# Patient Record
Sex: Female | Born: 1955
Health system: Southern US, Community
[De-identification: ages and names within clinical notes are randomized; demographics above are authoritative.]

## PROBLEM LIST (undated history)

## (undated) DIAGNOSIS — N809 Endometriosis, unspecified: Secondary | ICD-10-CM

## (undated) HISTORY — PX: ABDOMINAL HYSTERECTOMY: SHX81

---

## 2017-04-28 DIAGNOSIS — E119 Type 2 diabetes mellitus without complications: Secondary | ICD-10-CM | POA: Diagnosis present

## 2018-04-13 DIAGNOSIS — E0590 Thyrotoxicosis, unspecified without thyrotoxic crisis or storm: Secondary | ICD-10-CM | POA: Insufficient documentation

## 2019-03-08 DIAGNOSIS — B351 Tinea unguium: Secondary | ICD-10-CM | POA: Insufficient documentation

## 2019-12-14 ENCOUNTER — Encounter (HOSPITAL_COMMUNITY): Payer: Self-pay | Admitting: Internal Medicine

## 2019-12-14 ENCOUNTER — Ambulatory Visit (HOSPITAL_COMMUNITY)
Admission: EM | Admit: 2019-12-14 | Discharge: 2019-12-14 | Disposition: A | Discharge status: Nursing Home (Includes ICF) | Payer: Self-pay

## 2019-12-14 ENCOUNTER — Emergency Department (HOSPITAL_COMMUNITY): Payer: HRSA Program

## 2019-12-14 ENCOUNTER — Other Ambulatory Visit: Payer: Self-pay

## 2019-12-14 ENCOUNTER — Inpatient Hospital Stay (HOSPITAL_COMMUNITY)
Admission: EM | Admit: 2019-12-14 | Discharge: 2019-12-26 | DRG: 177 | Disposition: A | Discharge status: Home / Self Care | Payer: HRSA Program | Attending: Internal Medicine | Admitting: Internal Medicine

## 2019-12-14 ENCOUNTER — Encounter (HOSPITAL_COMMUNITY): Payer: Self-pay | Admitting: *Deleted

## 2019-12-14 DIAGNOSIS — T502X5A Adverse effect of carbonic-anhydrase inhibitors, benzothiadiazides and other diuretics, initial encounter: Secondary | ICD-10-CM | POA: Diagnosis present

## 2019-12-14 DIAGNOSIS — U071 COVID-19: Secondary | ICD-10-CM | POA: Diagnosis present

## 2019-12-14 DIAGNOSIS — E876 Hypokalemia: Secondary | ICD-10-CM | POA: Diagnosis present

## 2019-12-14 DIAGNOSIS — R0602 Shortness of breath: Secondary | ICD-10-CM

## 2019-12-14 DIAGNOSIS — E1165 Type 2 diabetes mellitus with hyperglycemia: Secondary | ICD-10-CM | POA: Diagnosis present

## 2019-12-14 DIAGNOSIS — R0902 Hypoxemia: Secondary | ICD-10-CM

## 2019-12-14 DIAGNOSIS — J069 Acute upper respiratory infection, unspecified: Secondary | ICD-10-CM | POA: Diagnosis present

## 2019-12-14 DIAGNOSIS — J9601 Acute respiratory failure with hypoxia: Secondary | ICD-10-CM | POA: Diagnosis present

## 2019-12-14 DIAGNOSIS — IMO0002 Reserved for concepts with insufficient information to code with codable children: Secondary | ICD-10-CM

## 2019-12-14 DIAGNOSIS — E119 Type 2 diabetes mellitus without complications: Secondary | ICD-10-CM | POA: Diagnosis present

## 2019-12-14 DIAGNOSIS — E871 Hypo-osmolality and hyponatremia: Secondary | ICD-10-CM | POA: Diagnosis present

## 2019-12-14 DIAGNOSIS — J1282 Pneumonia due to coronavirus disease 2019: Secondary | ICD-10-CM | POA: Diagnosis present

## 2019-12-14 DIAGNOSIS — Z90710 Acquired absence of both cervix and uterus: Secondary | ICD-10-CM

## 2019-12-14 DIAGNOSIS — R0603 Acute respiratory distress: Secondary | ICD-10-CM

## 2019-12-14 HISTORY — DX: Endometriosis, unspecified: N80.9

## 2019-12-14 LAB — FERRITIN: Ferritin: 508 ng/mL — ABNORMAL HIGH (ref 11–307)

## 2019-12-14 LAB — CBC WITH DIFFERENTIAL/PLATELET
Abs Immature Granulocytes: 0.02 10*3/uL (ref 0.00–0.07)
Basophils Absolute: 0 10*3/uL (ref 0.0–0.1)
Basophils Relative: 0 %
Eosinophils Absolute: 0 10*3/uL (ref 0.0–0.5)
Eosinophils Relative: 0 %
HCT: 37.3 % (ref 36.0–46.0)
Hemoglobin: 12.3 g/dL (ref 12.0–15.0)
Immature Granulocytes: 0 %
Lymphocytes Relative: 11 %
Lymphs Abs: 1 10*3/uL (ref 0.7–4.0)
MCH: 27.3 pg (ref 26.0–34.0)
MCHC: 33 g/dL (ref 30.0–36.0)
MCV: 82.7 fL (ref 80.0–100.0)
Monocytes Absolute: 0.4 10*3/uL (ref 0.1–1.0)
Monocytes Relative: 4 %
Neutro Abs: 7.1 10*3/uL (ref 1.7–7.7)
Neutrophils Relative %: 85 %
Platelets: 258 10*3/uL (ref 150–400)
RBC: 4.51 MIL/uL (ref 3.87–5.11)
RDW: 13.1 % (ref 11.5–15.5)
WBC: 8.4 10*3/uL (ref 4.0–10.5)
nRBC: 0 % (ref 0.0–0.2)

## 2019-12-14 LAB — LACTATE DEHYDROGENASE: LDH: 264 U/L — ABNORMAL HIGH (ref 98–192)

## 2019-12-14 LAB — URINALYSIS, ROUTINE W REFLEX MICROSCOPIC
Bilirubin Urine: NEGATIVE
Glucose, UA: NEGATIVE mg/dL
Hgb urine dipstick: NEGATIVE
Ketones, ur: NEGATIVE mg/dL
Leukocytes,Ua: NEGATIVE
Nitrite: NEGATIVE
Protein, ur: 100 mg/dL — AB
Specific Gravity, Urine: 1.014 (ref 1.005–1.030)
pH: 5 (ref 5.0–8.0)

## 2019-12-14 LAB — TRIGLYCERIDES: Triglycerides: 227 mg/dL — ABNORMAL HIGH (ref <150)

## 2019-12-14 LAB — COMPREHENSIVE METABOLIC PANEL
ALT: 25 U/L (ref 0–44)
AST: 35 U/L (ref 15–41)
Albumin: 3.2 g/dL — ABNORMAL LOW (ref 3.5–5.0)
Alkaline Phosphatase: 79 U/L (ref 38–126)
Anion gap: 13 (ref 5–15)
BUN: 9 mg/dL (ref 8–23)
CO2: 23 mmol/L (ref 22–32)
Calcium: 8.8 mg/dL — ABNORMAL LOW (ref 8.9–10.3)
Chloride: 101 mmol/L (ref 98–111)
Creatinine, Ser: 0.68 mg/dL (ref 0.44–1.00)
GFR calc Af Amer: >60 mL/min (ref >60)
GFR calc non Af Amer: >60 mL/min (ref >60)
Glucose, Bld: 169 mg/dL — ABNORMAL HIGH (ref 70–99)
Potassium: 3.2 mmol/L — ABNORMAL LOW (ref 3.5–5.1)
Sodium: 137 mmol/L (ref 135–145)
Total Bilirubin: 0.8 mg/dL (ref 0.3–1.2)
Total Protein: 7.1 g/dL (ref 6.5–8.1)

## 2019-12-14 LAB — FIBRINOGEN: Fibrinogen: >800 mg/dL — ABNORMAL HIGH (ref 210–475)

## 2019-12-14 LAB — D-DIMER, QUANTITATIVE: D-Dimer, Quant: 1.08 ug/mL-FEU — ABNORMAL HIGH (ref 0.00–0.50)

## 2019-12-14 LAB — MAGNESIUM: Magnesium: 1.9 mg/dL (ref 1.7–2.4)

## 2019-12-14 LAB — TROPONIN I (HIGH SENSITIVITY): Troponin I (High Sensitivity): 3 ng/L (ref <18)

## 2019-12-14 LAB — POC SARS CORONAVIRUS 2 AG -  ED: SARS Coronavirus 2 Ag: POSITIVE — AB

## 2019-12-14 LAB — PROCALCITONIN: Procalcitonin: <0.1 ng/mL

## 2019-12-14 LAB — C-REACTIVE PROTEIN: CRP: 19.9 mg/dL — ABNORMAL HIGH (ref <1.0)

## 2019-12-14 LAB — BRAIN NATRIURETIC PEPTIDE: B Natriuretic Peptide: 18.3 pg/mL (ref 0.0–100.0)

## 2019-12-14 MED ORDER — SODIUM CHLORIDE 0.9% FLUSH
3.0000 mL | Freq: Two times a day (BID) | INTRAVENOUS | Status: DC
  Administered 2019-12-14 – 2019-12-26 (×21): 3 mL via INTRAVENOUS

## 2019-12-14 MED ORDER — SODIUM CHLORIDE 0.9 % IV SOLN
100.0000 mg | Freq: Every day | INTRAVENOUS | Status: AC
  Administered 2019-12-15 – 2019-12-18 (×4): 100 mg via INTRAVENOUS
  Filled 2019-12-14 (×4): qty 20

## 2019-12-14 MED ORDER — ENOXAPARIN SODIUM 40 MG/0.4ML ~~LOC~~ SOLN
40.0000 mg | SUBCUTANEOUS | Status: DC
  Administered 2019-12-14 – 2019-12-25 (×12): 40 mg via SUBCUTANEOUS
  Filled 2019-12-14 (×12): qty 0.4

## 2019-12-14 MED ORDER — SODIUM CHLORIDE 0.9 % IV SOLN
200.0000 mg | Freq: Once | INTRAVENOUS | Status: AC
  Administered 2019-12-14: 200 mg via INTRAVENOUS
  Filled 2019-12-14: qty 40

## 2019-12-14 MED ORDER — TOCILIZUMAB 400 MG/20ML IV SOLN
8.0000 mg/kg | Freq: Once | INTRAVENOUS | Status: AC
  Administered 2019-12-15: 508 mg via INTRAVENOUS
  Filled 2019-12-14 (×3): qty 25.4

## 2019-12-14 MED ORDER — SODIUM CHLORIDE 0.9% FLUSH
3.0000 mL | Freq: Two times a day (BID) | INTRAVENOUS | Status: DC
  Administered 2019-12-14 – 2019-12-18 (×5): 3 mL via INTRAVENOUS

## 2019-12-14 MED ORDER — DEXAMETHASONE SODIUM PHOSPHATE 10 MG/ML IJ SOLN
6.0000 mg | Freq: Once | INTRAMUSCULAR | Status: AC
  Administered 2019-12-14: 6 mg via INTRAVENOUS
  Filled 2019-12-14: qty 1

## 2019-12-14 MED ORDER — DEXAMETHASONE SODIUM PHOSPHATE 10 MG/ML IJ SOLN
6.0000 mg | INTRAMUSCULAR | Status: AC
  Administered 2019-12-15 – 2019-12-23 (×9): 6 mg via INTRAVENOUS
  Filled 2019-12-14 (×10): qty 1

## 2019-12-14 MED ORDER — SODIUM CHLORIDE 0.9 % IV SOLN: 250.0000 mL | INTRAVENOUS | Status: DC | PRN

## 2019-12-14 MED ORDER — ONDANSETRON HCL 4 MG/2ML IJ SOLN: 4.0000 mg | Freq: Four times a day (QID) | INTRAMUSCULAR | Status: DC | PRN

## 2019-12-14 MED ORDER — ONDANSETRON HCL 4 MG PO TABS
4.0000 mg | ORAL_TABLET | Freq: Four times a day (QID) | ORAL | Status: DC | PRN
  Administered 2019-12-20: 4 mg via ORAL
  Filled 2019-12-14: qty 1

## 2019-12-14 MED ORDER — SODIUM CHLORIDE 0.9% FLUSH: 3.0000 mL | INTRAVENOUS | Status: DC | PRN

## 2019-12-14 MED ORDER — POTASSIUM CHLORIDE 10 MEQ/100ML IV SOLN
10.0000 meq | INTRAVENOUS | Status: AC
  Administered 2019-12-14 (×4): 10 meq via INTRAVENOUS
  Filled 2019-12-14 (×4): qty 100

## 2019-12-14 MED ORDER — GUAIFENESIN-DM 100-10 MG/5ML PO SYRP
10.0000 mL | ORAL_SOLUTION | ORAL | Status: DC | PRN
  Administered 2019-12-16 – 2019-12-25 (×5): 10 mL via ORAL
  Filled 2019-12-14 (×6): qty 10

## 2019-12-14 MED ORDER — ACETAMINOPHEN 325 MG PO TABS
650.0000 mg | ORAL_TABLET | Freq: Four times a day (QID) | ORAL | Status: DC | PRN
  Administered 2019-12-14 – 2019-12-25 (×7): 650 mg via ORAL
  Filled 2019-12-14 (×7): qty 2

## 2019-12-14 NOTE — ED Triage Notes (Signed)
Pt to ED via EMS from urgent care c/o shortness of breath. Pt known COVID positive- started experiencing increase in shortness of breath- so went to urgent care, o2 sats 75% on room air- 95% o2 sats on non rebreather. Per EMS pt took 1000mg  of Ibprofin earlier.

## 2019-12-14 NOTE — H&P (Signed)
Carrie Black VWA:[id number] DOB: May 06, 1956 DOA: 12/14/2019     PCP: System, Pcp Not In   Outpatient Specialists:  NONE    Patient arrived to ER on 12/14/19 at 1332  Patient coming from: home Lives  With family    Chief Complaint:   Chief Complaint  Patient presents with  . Shortness of Breath    COVID POSITIVE    HPI: Carrie Black is a 64 y.o. female with medical history significant of Covid test + December 13, 2019, endometriosis    Presented with  Cough, fever up to 100.4 have been reporting CP and SOB since yesterday. to care and was noted to be satting 76% on RA was placed on 5 L if her oxygenation improved to 90%.  She reports fevers at home up to 101.5 she has tried to take ibuprofen to control her fever.  She has been overall sick for the past 2 weeks but tested only positive yesterday. She has been treated by provider with prednisone  And azithromycin for the past few days but without improvement The been seen in urgent care she was transported by EMS to [facility], ER  Infectious risk factors:  Reports  fever, shortness of breath, dry cough, chest pain,     KNOWN COVID POSITIVE   In  ER RAPID COVID TEST    POSITIVE,    No results found for: SARSCOV2NAA    While in ER: Initially started nonrebreather 15 L with oxygen saturation 94% Started on remdesivir and dexamethasone     The following Work up has been ordered so far:  Orders Placed This Encounter  Procedures  . Critical Care  . Blood Culture (routine x 2)  . DG Chest Port 1 View  . Comprehensive metabolic panel  . CBC WITH DIFFERENTIAL  . Brain natriuretic peptide  . Urinalysis, Routine w reflex microscopic  . D-dimer, quantitative  . Procalcitonin  . Lactate dehydrogenase  . Ferritin  . Fibrinogen  . C-reactive protein  . Triglycerides  . Diet NPO time specified  . Cardiac monitoring  . Initiate Carrier Fluid Protocol  . Cardiac monitoring  . Initiate Carrier Fluid Protocol  . Place  surgical mask on patient  . Patient to wear surgical mask during transportation  . Assess patient for ability to self-prone. If able (can move self in bed, ambulate) and stable (SpO2 and oxygen requirement):  . RN/NT - Document specific oxygen requirements in CHL  . Notify EDP if new oxygen requirements escalates > 4L per minute [place]  . RN to draw the following extra tubes:  . Consult to hospitalist  ALL PATIENTS BEING ADMITTED/HAVING PROCEDURES NEED COVID-19 SCREENING  . Consult to hospitalist  ALL PATIENTS BEING ADMITTED/HAVING PROCEDURES NEED COVID-19 SCREENING  . remdesivir per pharmacy consult  . Airborne and Contact precautions  . Oxygen therapy  . Pulse oximetry, continuous  . Pulse oximetry, continuous  . POC SARS Coronavirus 2 Ag-ED - Nasal Swab (BD Veritor Kit)  . ED EKG  . EKG 12-Lead     Following Medications were ordered in ER: Medications  remdesivir 200 mg in sodium chloride 0.9% 250 mL IVPB (has no administration in time range)    Followed by  remdesivir 100 mg in sodium chloride 0.9 % 100 mL IVPB (has no administration in time range)  dexamethasone (DECADRON) injection 6 mg (6 mg Intravenous Given 12/14/19 1754)        Consult Orders  (From admission, onward)  Start     Ordered   12/14/19 1726  Consult to hospitalist  ALL PATIENTS BEING ADMITTED/HAVING PROCEDURES NEED COVID-19 SCREENING Pg sent by dee  Once    Comments: ALL PATIENTS BEING ADMITTED/HAVING PROCEDURES NEED COVID-19 SCREENING  Provider:  (Not yet assigned)  Question Answer Comment  Place call to: [facility]   Reason for Consult Admit      12/14/19 1725   12/14/19 1639  Consult to hospitalist  ALL PATIENTS BEING ADMITTED/HAVING PROCEDURES NEED COVID-19 SCREENING Pg sent by dee  Once    Comments: ALL PATIENTS BEING ADMITTED/HAVING PROCEDURES NEED COVID-19 SCREENING  Provider:  (Not yet assigned)  Question Answer Comment  Place call to: [facility]   Reason for Consult Admit        12/14/19 1638          Significant initial  Findings: Abnormal Labs Reviewed  COMPREHENSIVE METABOLIC PANEL - Abnormal; Notable for the following components:      Result Value   Potassium 3.2 (*)    Glucose, Bld 169 (*)    Calcium 8.8 (*)    Albumin 3.2 (*)    All other components within normal limits  URINALYSIS, ROUTINE W REFLEX MICROSCOPIC - Abnormal; Notable for the following components:   Protein, ur 100 (*)    Bacteria, UA RARE (*)    All other components within normal limits  D-DIMER, QUANTITATIVE (NOT AT [facility]) - Abnormal; Notable for the following components:   D-Dimer, Quant 1.08 (*)    All other components within normal limits  LACTATE DEHYDROGENASE - Abnormal; Notable for the following components:   LDH 264 (*)    All other components within normal limits  FERRITIN - Abnormal; Notable for the following components:   Ferritin 508 (*)    All other components within normal limits  FIBRINOGEN - Abnormal; Notable for the following components:   Fibrinogen >800 (*)    All other components within normal limits  C-REACTIVE PROTEIN - Abnormal; Notable for the following components:   CRP 19.9 (*)    All other components within normal limits  TRIGLYCERIDES - Abnormal; Notable for the following components:   Triglycerides 227 (*)    All other components within normal limits  POC SARS CORONAVIRUS 2 AG -  ED - Abnormal; Notable for the following components:   SARS Coronavirus 2 Ag POSITIVE (*)    All other components within normal limits    Otherwise labs showing:    Recent Labs  Lab 12/14/19 1407  NA 137  K 3.2*  CO2 23  GLUCOSE 169*  BUN 9  CREATININE 0.68  CALCIUM 8.8*    Cr    Stable  Lab Results  Component Value Date   CREATININE 0.68 12/14/2019    Recent Labs  Lab 12/14/19 1407  AST 35  ALT 25  ALKPHOS 79  BILITOT 0.8  PROT 7.1  ALBUMIN 3.2*   Lab Results  Component Value Date   CALCIUM 8.8 (L) 12/14/2019      WBC      Component  Value Date/Time   WBC 8.4 12/14/2019 1407   ANC    Component Value Date/Time   NEUTROABS 7.1 12/14/2019 1407   ALC No components found for: LYMPHAB    Plt: Lab Results  Component Value Date   PLT 258 12/14/2019    Procalcitonin <0.1   COVID-19 Labs  Recent Labs    12/14/19 1407  DDIMER 1.08*  FERRITIN 508*  LDH 264*  CRP  19.9*    No results found for: SARSCOV2NAA   HG/HCT   stable,       Component Value Date/Time   HGB 12.3 12/14/2019 1407   HCT 37.3 12/14/2019 1407     Troponin  ordered     ECG: Ordered Personally reviewed by me showing: HR : 91 Rhythm:  NSR,    no evidence of ischemic changes QTC 437   BNP (last 3 results) Recent Labs    12/14/19 1407  BNP 18.3       UA   no evidence of UTI    Urine analysis:    Component Value Date/Time   COLORURINE YELLOW 12/14/2019 1620   APPEARANCEUR CLEAR 12/14/2019 1620   LABSPEC 1.014 12/14/2019 1620   PHURINE 5.0 12/14/2019 1620   GLUCOSEU NEGATIVE 12/14/2019 1620   HGBUR NEGATIVE 12/14/2019 1620   BILIRUBINUR NEGATIVE 12/14/2019 1620   KETONESUR NEGATIVE 12/14/2019 1620   PROTEINUR 100 (A) 12/14/2019 1620   NITRITE NEGATIVE 12/14/2019 1620   LEUKOCYTESUR NEGATIVE 12/14/2019 1620       Ordered   CXR - viral PNA      ED Triage Vitals  Enc Vitals Group     BP 12/14/19 1335 117/67     Pulse Rate 12/14/19 1335 86     Resp 12/14/19 1338 (!) 26     Temp 12/14/19 1339 99.3 F (37.4 C)     Temp Source 12/14/19 1338 Tympanic     SpO2 12/14/19 1335 97 %     Weight 12/14/19 1337 140 lb (63.5 kg)     Height 12/14/19 1337 _0  (1.549 m)     Head Circumference --      Peak Flow --      Pain Score 12/14/19 1336 10     Pain Loc --      Pain Edu? --      Excl. in [place]? --   TMAX(24)@       Latest  Blood pressure 122/69, pulse 95, temperature 99.3 F (37.4 C), temperature source Oral, resp. rate (!) 34, height _1  (1.549 m), weight 63.5 kg, SpO2 97 %.     Hospitalist was called for  admission for acute respiratory failure secondary to Covid pneumonia   Review of Systems:    Pertinent positives include:  Fevers, chills, fatigue,  shortness of breath at rest. dyspnea on exertion,  non-productive cough  Constitutional:  No weight loss, night sweats, weight loss  HEENT:  No headaches, Difficulty swallowing,Tooth/dental problems,Sore throat,  No sneezing, itching, ear ache, nasal congestion, post nasal drip,  Cardio-vascular:  No chest pain, Orthopnea, PND, anasarca, dizziness, palpitations.no Bilateral lower extremity swelling  GI:  No heartburn, indigestion, abdominal pain, nausea, vomiting, diarrhea, change in bowel habits, loss of appetite, melena, blood in stool, hematemesis Resp:   , No coughing up of blood.No change in color of mucus.No wheezing. Skin:  no rash or lesions. No jaundice GU:  no dysuria, change in color of urine, no urgency or frequency. No straining to urinate.  No flank pain.  Musculoskeletal:  No joint pain or no joint swelling. No decreased range of motion. No back pain.  Psych:  No change in mood or affect. No depression or anxiety. No memory loss.  Neuro: no localizing neurological complaints, no tingling, no weakness, no double vision, no gait abnormality, no slurred speech, no confusion  All systems reviewed and apart from Carrie Black all are negative  Past Medical History:   Past Medical History:  Diagnosis Date  . Endometriosis       Past Surgical History:  Procedure Laterality Date  . ABDOMINAL HYSTERECTOMY      Social History:  Ambulatory   Independently      reports that she has never smoked. She has never used smokeless tobacco. She reports that she does not drink alcohol or use drugs.   Family History:    Family History  Problem Relation Age of Onset  . Diabetes Other     Allergies: Allergies  Allergen Reactions  . Hydrocodone Itching  . Hydrocodone-Acetaminophen Itching  . Morphine Itching     Prior to  Admission medications   Medication Sig Start Date End Date Taking? Authorizing Provider  ibuprofen (ADVIL) 200 MG tablet Take 400-800 mg by mouth every 6 (six) hours as needed for fever or headache (pain).   Yes [provider]  azithromycin (ZITHROMAX) 250 MG tablet Take 250-500 mg by mouth See admin instructions. 5 day course started 12/07/2019 - take 2 tablets (500 mg) by mouth 1st day, then take 1 tablet (250 mg) on days 2-5    [provider]  predniSONE (DELTASONE) 10 MG tablet Take 10-50 mg by mouth See admin instructions. Tapered course started 12/07/2019- take 5 tablets (50 mg) by mouth 1st day, then take 4 tablets (40 mg) 2nd day, then take 3 tablets (30 mg) 3rd day, then take 2 tablets (20 mg) 4th day, then take 1 tablet (10 mg) 5th day, then stop    [provider]   Physical Exam: Blood pressure 122/69, pulse 95, temperature 99.3 F (37.4 C), temperature source Oral, resp. rate (!) 34, height '5\' 1"'$  (1.549 m), weight 63.5 kg, SpO2 97 %. 1. General:  in  Acute distress  increased work of breathing     acutely ill -appearing 2. Psychological: Alert and   Oriented 3. Head/ENT:   Dry Mucous Membranes                          Head Non traumatic, neck supple                           Poor Dentition 4. SKIN:   decreased Skin turgor,  Skin clean Dry and intact no rash 5. Heart: Regular rate and rhythm no  Murmur, no Rub or gallop 6. Lungs:  no wheezes or crackles   7. Abdomen: Soft,  non-tender, Non distended  bowel sounds present 8. Lower extremities: no clubbing, cyanosis, no edema 9. Neurologically Grossly intact, moving all 4 extremities equally  10. MSK: Normal range of motion   All other LABS:     Recent Labs  Lab 12/14/19 1407  WBC 8.4  NEUTROABS 7.1  HGB 12.3  HCT 37.3  MCV 82.7  PLT 258     Recent Labs  Lab 12/14/19 1407  NA 137  K 3.2*  CL 101  CO2 23  GLUCOSE 169*  BUN 9  CREATININE 0.68  CALCIUM 8.8*     Recent Labs  Lab  12/14/19 1407  AST 35  ALT 25  ALKPHOS 79  BILITOT 0.8  PROT 7.1  ALBUMIN 3.2*       Cultures: No results found for: SDES, SPECREQUEST, CULT, REPTSTATUS   Radiological Exams on Admission: DG Chest Port 1 View  Result Date: 12/14/2019 CLINICAL DATA:  Shortness of breath EXAM: PORTABLE CHEST 1 VIEW COMPARISON:  None FINDINGS: There is bilateral  lower lobe and to lesser extent upper lobe airspace disease most concerning for multilobar pneumonia including atypical viral pneumonia. There is no pleural effusion or pneumothorax. The heart and mediastinal contours are unremarkable. There is no acute osseous abnormality. IMPRESSION: Bilateral lower lobe and to lesser extent upper lobe airspace disease most concerning for multilobar pneumonia including atypical viral pneumonia. Electronically Signed   By: Kathreen Devoid   On: 12/14/2019 14:21    Chart has been reviewed    Assessment/Plan  64 y.o. female with medical history significant of Covid test + December 13, 2019, endometriosis     Admitted for Covid PNA with acute respiratory failure  Present on Admission: . Pneumonia due to COVID-19 virus -   FROM HOME  WITH KNOWN HX OF COVID19 ER Novel Corona Virus testing: Ordered 12/14/19 and is positive   Following concerning LAB/ imaging findings:  CBC: leukopenia, lymphopenia    ANC/ALC ratio>3.5 CRP, LDH: increased   IL-6 and Ferritin increased  Procalcitonin: low   CXR: hazy bilateral peripheral opacities     -Following work-up initiated:      sputum cultures  Ordered 12/14/19, Blood cultures  Ordered 12/14/19,     Following complications noted:   nausea/vomiting Diarrhea , decreased PO intake resulting in dehydration - will rehydrate   acute respiratory failure with hypoxia - continue oxygen treatment  Plan of treatment: - Transfer to [facility] facility if   bed is available and meets criteria for transfer  Spoke to dr. Vanita Ingles -given severity of illness initiate steroids  Decadron '6mg'$  q  24 hours And pharmacy consult for remdesivir -  Given  Hypoxia rapidly advancing   and no contraindications will attempt a trial of Actemra (discussed with patient risks and benefits) - Will follow daily d.dimer - Assess for ability to prone  - Supportive management -Fluid sparing resuscitation  -Provide oxygen as needed currently on  SpO2: 97 % O2 Flow Rate (L/min): 10 L/min - IF d.dimer elvated >5 will increase dose of lovenox    Poor Prognostic factors  65 y.o.   Evidence of  organ damage  Present respiratory failure requiring >4L [place]  tachypnea, tachycardia present on admission  ABS neutrophil to lymphocyte ratio >3.5     Will order Airborne and Contact precautions  Family/ patient prognosis discussion: I have discussed case with the   patient  who are aware of their prognosis At this point they would like to be full code     . Hypokalemia - - will replace and repeat in AM,  check magnesium level and replace as needed  . Acute respiratory disease due to COVID-19 virus -continue providing oxygen as needed monitor on continuous pulse ox self ability to prone   Other plan as per orders.  DVT prophylaxis:   Lovenox     Code Status:  FULL CODE as per patient  I had personally discussed CODE STATUS with patient   Family Communication:   Family not at  Bedside   Disposition Plan:    To home once workup is complete and patient is stable                      Consults called: none    Admission status:  ED Disposition    ED Disposition Condition Comment   Admit  The patient appears reasonably stabilized for admission considering the current resources, flow, and capabilities available in the ED at this time, and I doubt any other [facility] requiring  further screening and/or treatment in the ED prior to admission is  present.        inpatient     Expect 2 midnight stay secondary to severity of patient's current illness including   hemodynamic instability despite  optimal treatment (tachycardia hypoxia, )  Severe lab/radiological/exam abnormalities including:  PNA    I expect  patient to be hospitalized for 2 midnights requiring inpatient medical care.  Patient is at high risk for adverse outcome (such as loss of life or disability) if not treated.  Indication for inpatient stay as follows:    Hemodynamic instability despite maximal medical therapy,    New or worsening hypoxia  Need for IV antivirals, IV steroids     Level of care     SDU tele indefinitely please discontinue once patient no longer qualifies   Precautions: admitted as  covid positive Airborne and Contact precautions    PPE: Used by the provider:   P100  eye Goggles,  Gloves  gown   Alajah Witman 12/14/2019, 8:19 PM    [facility]     after 2 AM please page floor coverage PA If 7AM-7PM, please contact the day team taking care of the patient using [email or web address]   Patient was evaluated in the context of the global COVID-19 pandemic, which necessitated consideration that the patient might be at risk for infection with the SARS-CoV-2 virus that causes COVID-19. Institutional protocols and algorithms that pertain to the evaluation of patients at risk for COVID-19 are in a state of rapid change based on information released by regulatory bodies including the CDC and federal and state organizations. These policies and algorithms were followed during the patient's care.

## 2019-12-14 NOTE — ED Triage Notes (Signed)
C/O cough with fevers up to 100.4 x 1 wk.  C/O chest pain and SOB since yesterday.  Pt placed on O2 @ 5L via N/C with SaO2 increasing to 90%.  Colin Mulders, NP notified of pt.  [facility] notified of need for transport.

## 2019-12-14 NOTE — ED Provider Notes (Signed)
[facility] EMERGENCY DEPARTMENT Provider Note   CSN: [id number] Arrival date & time: 12/14/19  1332     History Chief Complaint  Patient presents with   Shortness of Breath    COVID POSITIVE    Carrie Black is a 64 y.o. female.  HPI     Presents with 1 week of illness.  Wife notes that prior to this she was generally well, denies chronic medical problems. For about the past week patient has had fever, cough, fatigue, nausea. No relief with OTC medication. Patient was tested yesterday for Covid, but that was result is not yet available. She presents today after having persistent elevated fever, greater than 100, with shortness of breath and cough.   Past Medical History:  Diagnosis Date   Endometriosis     There are no problems to display for this patient.   Past Surgical History:  Procedure Laterality Date   ABDOMINAL HYSTERECTOMY       OB History   No obstetric history on file.     No family history on file.  Social History   Tobacco Use   Smoking status: Never Smoker   Smokeless tobacco: Never Used  Substance Use Topics   Alcohol use: Not Currently   Drug use: Never    Home Medications Prior to Admission medications   Medication Sig Start Date End Date Taking? Authorizing Provider  IBUPROFEN PO Take by mouth.    [provider]  PREDNISONE PO Take by mouth. Completed course this past week    [provider]    Allergies    Patient has no known allergies.  Review of Systems   Review of Systems  Constitutional:       Per HPI, otherwise negative  HENT:       Per HPI, otherwise negative  Respiratory:       Per HPI, otherwise negative  Cardiovascular:       Per HPI, otherwise negative  Gastrointestinal: Positive for nausea. Negative for abdominal pain and vomiting.  Endocrine:       Negative aside from HPI  Genitourinary:       Neg aside from HPI   Musculoskeletal:       Per HPI, otherwise  negative  Skin: Negative.   Allergic/Immunologic: Negative for immunocompromised state.  Neurological: Negative for syncope.    Physical Exam Updated Vital Signs BP 117/67    Pulse 86    Temp 99.3 F (37.4 C) (Oral)    Resp (!) 26    Ht 5\' 1"  (1.549 m)    Wt 63.5 kg    SpO2 96%    BMI 26.45 kg/m   Physical Exam Vitals and nursing note reviewed.  Constitutional:      General: She is not in acute distress.    Appearance: She is well-developed.  HENT:     Head: Normocephalic and atraumatic.  Eyes:     Conjunctiva/sclera: Conjunctivae normal.  Cardiovascular:     Rate and Rhythm: Regular rhythm. Tachycardia present.  Pulmonary:     Effort: Pulmonary effort is normal. Tachypnea present.     Breath sounds: Decreased breath sounds present.  Abdominal:     General: There is no distension.  Skin:    General: Skin is warm and dry.  Neurological:     Mental Status: She is alert and oriented to person, place, and time.     Cranial Nerves: No cranial nerve deficit.     ED Results / Procedures /  Treatments   Labs (all labs ordered are listed, but only abnormal results are displayed) Labs Reviewed  COMPREHENSIVE METABOLIC PANEL - Abnormal; Notable for the following components:      Result Value   Potassium 3.2 (*)    Glucose, Bld 169 (*)    Calcium 8.8 (*)    Albumin 3.2 (*)    All other components within normal limits  CBC WITH DIFFERENTIAL/PLATELET  BRAIN NATRIURETIC PEPTIDE  URINALYSIS, ROUTINE W REFLEX MICROSCOPIC  POC SARS CORONAVIRUS 2 AG -  ED    EKG EKG Interpretation  Date/Time:  Saturday December 14 2019 13:40:20 EST Ventricular Rate:  91 PR Interval:    QRS Duration: 86 QT Interval:  355 QTC Calculation: 437 R Axis:   136 Text Interpretation: Right and left arm electrode reversal, interpretation assumes no reversal Sinus or ectopic atrial rhythm Ventricular premature complex Aberrant conduction of SV complex(es) Right ventricular hypertrophy Inferior infarct,  old Probable lateral infarct, age indeterminate Artifact Abnormal ECG Confirmed by Carmin Muskrat [phone]) on 12/14/2019 2:45:15 PM   Radiology DG Chest Port 1 View  Result Date: 12/14/2019 CLINICAL DATA:  Shortness of breath EXAM: PORTABLE CHEST 1 VIEW COMPARISON:  None FINDINGS: There is bilateral lower lobe and to lesser extent upper lobe airspace disease most concerning for multilobar pneumonia including atypical viral pneumonia. There is no pleural effusion or pneumothorax. The heart and mediastinal contours are unremarkable. There is no acute osseous abnormality. IMPRESSION: Bilateral lower lobe and to lesser extent upper lobe airspace disease most concerning for multilobar pneumonia including atypical viral pneumonia. Electronically Signed   By: Kathreen Devoid   On: 12/14/2019 14:21    Procedures Procedures (including critical care time)  Medications Ordered in ED Medications - No data to display  ED Course  I have reviewed the triage vital signs and the nursing notes.  Pertinent labs & imaging results that were available during my care of the patient were reviewed by me and considered in my medical decision making (see chart for details).    MDM Rules/Calculators/A&P                     This adult female previously well presents with 1 week of illness consistent with Covid versus influenza versus pneumonia. Patient is awake and alert, but tachypneic, and with increased work of breathing requires oxygen after she was found to be hypoxic, with a saturation of 75% initially on room air.  Oxygenation improved to normal range with supplemental oxygen, 4 L via nasal cannula.  Patient will require admission with Covid primary consideration, though pneumonia also considered as above.  Dr. Reather Converse is aware of the patient.  Final Clinical Impression(s) / ED Diagnoses Final diagnoses:  Hypoxia     Carmin Muskrat, MD 12/14/19 [phone]

## 2019-12-14 NOTE — ED Notes (Signed)
N/C discontinued; placed on NRB mask @ 15L with SaO2 = 94%.

## 2019-12-14 NOTE — ED Notes (Signed)
Attempted to call report to [facility], RN taking report did not answer, however, another nurse answered and said they will call back.

## 2019-12-14 NOTE — ED Provider Notes (Signed)
[facility]    CSN: [id number] Arrival date & time: 12/14/19  1230      History   Chief Complaint Chief Complaint  Patient presents with  . Covid Positive  . Shortness of Breath    HPI Carrie Black is a 64 y.o. female.   HPI Carrie Black presents today in respiratory distress with oxygen saturations 76% on arrival. Patient is having chest tightness and shortness of breath which has worsened since yesterday. She has experienced measurable fevers TMAX 101.5 F. She has taken ibuprofen daily for fever management. Tested positive for COVID-19 yesterday. Reports feeling poorly for about 2 weeks.    Past Medical History:  Diagnosis Date  . Endometriosis     There are no problems to display for this patient.   Past Surgical History:  Procedure Laterality Date  . ABDOMINAL HYSTERECTOMY      OB History   No obstetric history on file.      Home Medications    Prior to Admission medications   Medication Sig Start Date End Date Taking? Authorizing Provider  IBUPROFEN PO Take by mouth.   Yes [provider]  PREDNISONE PO Take by mouth. Completed course this past week   Yes [provider]    Family History History reviewed. No pertinent family history.  Social History Social History   Tobacco Use  . Smoking status: Never Smoker  . Smokeless tobacco: Never Used  Substance Use Topics  . Alcohol use: Not Currently  . Drug use: Never     Allergies   Patient has no known allergies.   Review of Systems Review of Systems Pertinent negatives listed in HPI Physical Exam Triage Vital Signs ED Triage Vitals  Enc Vitals Group     BP      Pulse      Resp      Temp      Temp src      SpO2      Weight      Height      Head Circumference      Peak Flow      Pain Score      Pain Loc      Pain Edu?      Excl. in GC?    No data found.  Updated Vital Signs BP 134/79   Pulse (!) 107   Temp 99.1 F (37.3 C) (Oral)   Resp (!)  32   SpO2 93%   Visual Acuity Right Eye Distance:   Left Eye Distance:   Bilateral Distance:    Right Eye Near:   Left Eye Near:    Bilateral Near:     Physical Exam Constitutional:      General: She is in acute distress.     Appearance: She is ill-appearing and toxic-appearing.  Cardiovascular:     Rate and Rhythm: Tachycardia present.  Pulmonary:     Effort: Accessory muscle usage and respiratory distress present.     Breath sounds: Examination of the right-upper field reveals rales. Examination of the left-upper field reveals rales. Rales present.  Chest:     Chest wall: Tenderness present.  Abdominal:     Palpations: Abdomen is soft.  Skin:    General: Skin is dry.     Coloration: Skin is pale.  Neurological:     General: No focal deficit present.      UC Treatments / Results  Labs (all labs ordered are listed, but only abnormal results  are displayed) Labs Reviewed - No data to display  EKG   Radiology No results found.  Procedures Procedures (including critical care time)  Medications Ordered in UC Medications - No data to display  Initial Impression / Assessment and Plan / UC Course  I have reviewed the triage vital signs and the nursing notes.  Pertinent labs & imaging results that were available during my care of the patient were reviewed by me and considered in my medical decision making (see chart for details).     Patient presents in acute distress following a recent diagnosis of COVID-19. She is short of breath with chest pain and has measurable fever. Patient oxygen saturation level 76%, improved to 93% with 5 liters of oxygen nasal canula. EMS called and patient transported to [facility] ER. Final Clinical Impressions(s) / UC Diagnoses   Final diagnoses:  Acute respiratory distress  SOB (shortness of breath)  COVID-19   Discharge Instructions   None    ED Prescriptions    None     PDMP not reviewed this encounter.   Scot Jun, FNP 12/14/19 1312

## 2019-12-14 NOTE — ED Provider Notes (Signed)
Patient CARE handed off to continue to monitor, follow-up blood work, follow-up and reassess clinical status.  Patient persistently tachypneic, requiring 10 L oxygen.  Concern clinically for Covid pneumonia.  With patient's work of breathing, requiring oxygenation and age plan for admission.  Covid labs ordered.  Plan discussed with hospitalist for admission.  Chest x-ray reviewed consistent with multi pneumonia.   Blood work results reviewed overall reassuring no significant anemia, normal white blood cell count. K 3.2.   Spoke with hospitalist, accepted.  Labs Reviewed  COMPREHENSIVE METABOLIC PANEL - Abnormal; Notable for the following components:      Result Value   Potassium 3.2 (*)    Glucose, Bld 169 (*)    Calcium 8.8 (*)    Albumin 3.2 (*)    All other components within normal limits  CULTURE, BLOOD (ROUTINE X 2)  CULTURE, BLOOD (ROUTINE X 2)  CBC WITH DIFFERENTIAL/PLATELET  BRAIN NATRIURETIC PEPTIDE  URINALYSIS, ROUTINE W REFLEX MICROSCOPIC  D-DIMER, QUANTITATIVE (NOT AT [facility])  PROCALCITONIN  LACTATE DEHYDROGENASE  FERRITIN  TRIGLYCERIDES  FIBRINOGEN  C-REACTIVE PROTEIN  POC SARS CORONAVIRUS 2 AG -  ED      .Critical Care Performed by: Blane Ohara, MD Authorized by: Blane Ohara, MD   Critical care provider statement:    Critical care time (minutes):  35   Critical care start time:  12/14/2019 4:00 PM   Critical care end time:  12/14/2019 4:35 PM   Critical care time was exclusive of:  Separately billable procedures and treating other patients and teaching time   Critical care was necessary to treat or prevent imminent or life-threatening deterioration of the following conditions:  Respiratory failure   Critical care was time spent personally by me on the following activities:  Evaluation of patient's response to treatment, examination of patient, ordering and performing treatments and interventions, ordering and review of laboratory studies, ordering and review of  radiographic studies, pulse oximetry, re-evaluation of patient's condition and review of old charts  Carrie Black was evaluated in Emergency Department on 12/14/2019 for the symptoms described in the history of present illness. She was evaluated in the context of the global COVID-19 pandemic, which necessitated consideration that the patient might be at risk for infection with the SARS-CoV-2 virus that causes COVID-19. Institutional protocols and algorithms that pertain to the evaluation of patients at risk for COVID-19 are in a state of rapid change based on information released by regulatory bodies including the CDC and federal and state organizations. These policies and algorithms were followed during the patient's care in the ED.     Blane Ohara, MD 12/14/19 1827

## 2019-12-14 NOTE — ED Notes (Signed)
Report Called over to [facility]. Foye Clock, RN received report.

## 2019-12-14 NOTE — ED Notes (Signed)
Date and time results received: 12/14/19  Test: covid Critical Value: positive  Name of Provider Notified: [email or web address]

## 2019-12-14 NOTE — ED Notes (Signed)
Attempted to call report to [facility], Foye Clock RN did not answer.

## 2019-12-15 DIAGNOSIS — J9601 Acute respiratory failure with hypoxia: Secondary | ICD-10-CM | POA: Diagnosis present

## 2019-12-15 DIAGNOSIS — R739 Hyperglycemia, unspecified: Secondary | ICD-10-CM

## 2019-12-15 LAB — HIV ANTIBODY (ROUTINE TESTING W REFLEX): HIV Screen 4th Generation wRfx: NONREACTIVE

## 2019-12-15 LAB — CBC WITH DIFFERENTIAL/PLATELET
Abs Immature Granulocytes: 0.01 10*3/uL (ref 0.00–0.07)
Basophils Absolute: 0 10*3/uL (ref 0.0–0.1)
Basophils Relative: 0 %
Eosinophils Absolute: 0 10*3/uL (ref 0.0–0.5)
Eosinophils Relative: 0 %
HCT: 35.5 % — ABNORMAL LOW (ref 36.0–46.0)
Hemoglobin: 11.9 g/dL — ABNORMAL LOW (ref 12.0–15.0)
Immature Granulocytes: 0 %
Lymphocytes Relative: 11 %
Lymphs Abs: 0.6 10*3/uL — ABNORMAL LOW (ref 0.7–4.0)
MCH: 27.9 pg (ref 26.0–34.0)
MCHC: 33.5 g/dL (ref 30.0–36.0)
MCV: 83.3 fL (ref 80.0–100.0)
Monocytes Absolute: 0.2 10*3/uL (ref 0.1–1.0)
Monocytes Relative: 4 %
Neutro Abs: 4.6 10*3/uL (ref 1.7–7.7)
Neutrophils Relative %: 85 %
Platelets: 297 10*3/uL (ref 150–400)
RBC: 4.26 MIL/uL (ref 3.87–5.11)
RDW: 13.4 % (ref 11.5–15.5)
WBC: 5.4 10*3/uL (ref 4.0–10.5)
nRBC: 0 % (ref 0.0–0.2)

## 2019-12-15 LAB — COMPREHENSIVE METABOLIC PANEL
ALT: 26 U/L (ref 0–44)
AST: 32 U/L (ref 15–41)
Albumin: 3.2 g/dL — ABNORMAL LOW (ref 3.5–5.0)
Alkaline Phosphatase: 75 U/L (ref 38–126)
Anion gap: 10 (ref 5–15)
BUN: 13 mg/dL (ref 8–23)
CO2: 23 mmol/L (ref 22–32)
Calcium: 8.8 mg/dL — ABNORMAL LOW (ref 8.9–10.3)
Chloride: 106 mmol/L (ref 98–111)
Creatinine, Ser: 0.57 mg/dL (ref 0.44–1.00)
GFR calc Af Amer: >60 mL/min (ref >60)
GFR calc non Af Amer: >60 mL/min (ref >60)
Glucose, Bld: 282 mg/dL — ABNORMAL HIGH (ref 70–99)
Potassium: 3.8 mmol/L (ref 3.5–5.1)
Sodium: 139 mmol/L (ref 135–145)
Total Bilirubin: 0.4 mg/dL (ref 0.3–1.2)
Total Protein: 7.3 g/dL (ref 6.5–8.1)

## 2019-12-15 LAB — FERRITIN: Ferritin: 527 ng/mL — ABNORMAL HIGH (ref 11–307)

## 2019-12-15 LAB — HEMOGLOBIN A1C
Hgb A1c MFr Bld: 7.6 % — ABNORMAL HIGH (ref 4.8–5.6)
Mean Plasma Glucose: 171.42 mg/dL

## 2019-12-15 LAB — TYPE AND SCREEN
ABO/RH(D): B POS
Antibody Screen: NEGATIVE

## 2019-12-15 LAB — GLUCOSE, CAPILLARY
Glucose-Capillary: 272 mg/dL — ABNORMAL HIGH (ref 70–99)
Glucose-Capillary: 232 mg/dL — ABNORMAL HIGH (ref 70–99)
Glucose-Capillary: 225 mg/dL — ABNORMAL HIGH (ref 70–99)

## 2019-12-15 LAB — MAGNESIUM: Magnesium: 2 mg/dL (ref 1.7–2.4)

## 2019-12-15 LAB — ABO/RH: ABO/RH(D): B POS

## 2019-12-15 LAB — C-REACTIVE PROTEIN: CRP: 25.2 mg/dL — ABNORMAL HIGH (ref <1.0)

## 2019-12-15 LAB — HEPATITIS PANEL, ACUTE
HCV Ab: NONREACTIVE
Hep A IgM: NONREACTIVE
Hep B C IgM: NONREACTIVE
Hepatitis B Surface Ag: NONREACTIVE

## 2019-12-15 LAB — D-DIMER, QUANTITATIVE: D-Dimer, Quant: 1.16 ug/mL-FEU — ABNORMAL HIGH (ref 0.00–0.50)

## 2019-12-15 LAB — PHOSPHORUS: Phosphorus: 2.8 mg/dL (ref 2.5–4.6)

## 2019-12-15 MED ORDER — SODIUM CHLORIDE 0.9% IV SOLUTION: Freq: Once | INTRAVENOUS | Status: AC

## 2019-12-15 MED ORDER — INSULIN ASPART 100 UNIT/ML ~~LOC~~ SOLN
0.0000 [IU] | SUBCUTANEOUS | Status: DC
  Administered 2019-12-15: 5 [IU] via SUBCUTANEOUS
  Administered 2019-12-15: 8 [IU] via SUBCUTANEOUS
  Administered 2019-12-15: 5 [IU] via SUBCUTANEOUS
  Administered 2019-12-16: 8 [IU] via SUBCUTANEOUS
  Administered 2019-12-16: 5 [IU] via SUBCUTANEOUS
  Administered 2019-12-16: 3 [IU] via SUBCUTANEOUS
  Administered 2019-12-16: 11 [IU] via SUBCUTANEOUS
  Administered 2019-12-16: 8 [IU] via SUBCUTANEOUS
  Administered 2019-12-16: 15 [IU] via SUBCUTANEOUS
  Administered 2019-12-16 – 2019-12-17 (×2): 8 [IU] via SUBCUTANEOUS
  Administered 2019-12-17: 5 [IU] via SUBCUTANEOUS
  Administered 2019-12-17 (×2): 3 [IU] via SUBCUTANEOUS
  Administered 2019-12-17: 5 [IU] via SUBCUTANEOUS
  Administered 2019-12-18: 8 [IU] via SUBCUTANEOUS
  Administered 2019-12-18 (×2): 3 [IU] via SUBCUTANEOUS
  Administered 2019-12-18: 8 [IU] via SUBCUTANEOUS

## 2019-12-15 MED ORDER — ASCORBIC ACID 500 MG PO TABS
500.0000 mg | ORAL_TABLET | Freq: Every day | ORAL | Status: DC
  Administered 2019-12-15 – 2019-12-26 (×12): 500 mg via ORAL
  Filled 2019-12-15 (×12): qty 1

## 2019-12-15 MED ORDER — IPRATROPIUM-ALBUTEROL 20-100 MCG/ACT IN AERS
1.0000 | INHALATION_SPRAY | Freq: Four times a day (QID) | RESPIRATORY_TRACT | Status: DC
  Administered 2019-12-15 – 2019-12-26 (×42): 1 via RESPIRATORY_TRACT
  Filled 2019-12-15: qty 4

## 2019-12-15 MED ORDER — ZINC SULFATE 220 (50 ZN) MG PO CAPS
220.0000 mg | ORAL_CAPSULE | Freq: Every day | ORAL | Status: DC
  Administered 2019-12-15 – 2019-12-26 (×12): 220 mg via ORAL
  Filled 2019-12-15 (×12): qty 1

## 2019-12-15 NOTE — Progress Notes (Signed)
Pt has been eating and removing mask while doing so. She appears to be somewhat comfortable

## 2019-12-15 NOTE — Progress Notes (Signed)
RN spoke with daughter and son on the phone. Updates given. Dr. Joseph Art at bedside. No further concerns.

## 2019-12-15 NOTE — Progress Notes (Signed)
RN spoke with daughter on the phone about plasma consent. Patient signed the consent. Dr. Joseph Art also signed. Consent faxed.

## 2019-12-15 NOTE — Plan of Care (Signed)
  Problem: Education: Goal: Knowledge of risk factors and measures for prevention of condition will improve Outcome: Progressing   Problem: Coping: Goal: Psychosocial and spiritual needs will be supported Outcome: Progressing   Problem: Respiratory: Goal: Will maintain a patent airway Outcome: Progressing Goal: Complications related to the disease process, condition or treatment will be avoided or minimized Outcome: Progressing   

## 2019-12-15 NOTE — Progress Notes (Signed)
PROGRESS NOTE    Carrie Black  GEX:[id number] DOB: Oct 29, 1956 DOA: 12/14/2019 PCP: System, Pcp Not In   Brief Narrative:  64 y.o.  Hispanic female (Spanish speaker), PMHx endometriosis  Presented with  Cough, fever up to 100.4 have been reporting CP and SOB since yesterday. to care and was noted to be satting 76% on RA was placed on 5 L if her oxygenation improved to 90%.  She reports fevers at home up to 101.5 she has tried to take ibuprofen to control her fever.  She has been overall sick for the past 2 weeks but tested only positive yesterday. She has been treated by provider with prednisone  And azithromycin for the past few days but without improvement The been seen in urgent care she was transported by EMS to [facility], ER    Subjective: T-max last 24 hours 38 C.  A/O x4, positive S OB.  States negative history of hepatitis, tuberculosis, chemotherapy, immunomodulating medication.   Assessment & Plan:   Active Problems:   Pneumonia due to COVID-19 virus   Hypokalemia   Acute respiratory disease due to COVID-19 virus   Acute respiratory failure with hypoxia (HCC)   Covid pneumonia/acute respiratory failure with hypoxia COVID-19 Labs  Recent Labs    12/14/19 1407 12/15/19 0850  DDIMER 1.08* 1.16*  FERRITIN 508* 527*  LDH 264*  --   CRP 19.9* 25.2*   1/23 POC SARS coronavirus positive  -Decadron 6 mg daily -Remdesivir per pharmacy protocol -1/23 Actemra x1 dose -1/24 transfuse 1 unit Covid convalescent plasma -Combivent -Vitamins per Covid protocol -Titrate O2 to maintain SPO2> 88% -Prone patient 16 hours/day, if patient cannot tolerate prone 2 to 3 hours per shift  Hyperglycemia -Hemoglobin A1c pending -Moderate SSI    DVT prophylaxis: Lovenox Code Status: Full Family Communication:  Disposition Plan: TBD   Consultants:    Procedures/Significant Events:     I have personally reviewed and interpreted all radiology studies and my findings are  as above.  VENTILATOR SETTINGS: NRB 1/24 Flow; 15 L/min SPO2; 92%   Cultures 1/22 Covid test positive 1/23 POC SARS coronavirus positive 1/23 sputum pending 1/23 blood pending  1/24 acute hepatitis panel pending    Antimicrobials: Anti-infectives (From admission, onward)   Start     Dose/Rate Stop   12/15/19 1000  remdesivir 100 mg in sodium chloride 0.9 % 100 mL IVPB     100 mg 200 mL/hr over 30 Minutes 12/19/19 0959   12/14/19 2000  remdesivir 200 mg in sodium chloride 0.9% 250 mL IVPB     200 mg 580 mL/hr over 30 Minutes 12/14/19 2024       Devices    LINES / TUBES:      Continuous Infusions: . sodium chloride    . remdesivir 100 mg in NS 100 mL 100 mg (12/15/19 1014)     Objective: Vitals:   12/15/19 0202 12/15/19 0500 12/15/19 0800 12/15/19 0911  BP: 116/78 105/64 117/74   Pulse: 84 71 80   Resp: 18 (!) 22 (!) 21   Temp: 97.9 F (36.6 C) 98.7 F (37.1 C) 98.7 F (37.1 C)   TempSrc: Oral Oral Oral   SpO2: 91% 92% 90% (!) 86%  Weight:      Height:       No intake or output data in the 24 hours ending 12/15/19 1152 Filed Weights   12/14/19 1337  Weight: 63.5 kg    Examination:  General: A/O x4, positive acute respiratory  distress Eyes: negative scleral hemorrhage, negative anisocoria, negative icterus ENT: Negative Runny nose, negative gingival bleeding, Neck:  Negative scars, masses, torticollis, lymphadenopathy, JVD Lungs: Tachypneic decreased breath sounds bilaterally without wheezes or crackles Cardiovascular: Regular rate and rhythm without murmur gallop or rub normal S1 and S2 Abdomen: negative abdominal pain, nondistended, positive soft, bowel sounds, no rebound, no ascites, no appreciable mass Extremities: No significant cyanosis, clubbing, or edema bilateral lower extremities Skin: Negative rashes, lesions, ulcers Psychiatric:  Negative depression, negative anxiety, negative fatigue, negative mania  Central nervous system:   Cranial nerves II through XII intact, tongue/uvula midline, all extremities muscle strength 5/5, sensation intact throughout, negative dysarthria, negative expressive aphasia, negative receptive aphasia.  .     Data Reviewed: Care during the described time interval was provided by me .  I have reviewed this patient's available data, including medical history, events of note, physical examination, and all test results as part of my evaluation.   CBC: Recent Labs  Lab 12/14/19 1407 12/15/19 0850  WBC 8.4 5.4  NEUTROABS 7.1 4.6  HGB 12.3 11.9*  HCT 37.3 35.5*  MCV 82.7 83.3  PLT 258 494   Basic Metabolic Panel: Recent Labs  Lab 12/14/19 1407 12/14/19 1939 12/15/19 0850  NA 137  --  139  K 3.2*  --  3.8  CL 101  --  106  CO2 23  --  23  GLUCOSE 169*  --  282*  BUN 9  --  13  CREATININE 0.68  --  0.57  CALCIUM 8.8*  --  8.8*  MG  --  1.9 2.0  PHOS  --   --  2.8   GFR: Estimated Creatinine Clearance: 61.5 mL/min (by C-G formula based on SCr of 0.57 mg/dL). Liver Function Tests: Recent Labs  Lab 12/14/19 1407 12/15/19 0850  AST 35 32  ALT 25 26  ALKPHOS 79 75  BILITOT 0.8 0.4  PROT 7.1 7.3  ALBUMIN 3.2* 3.2*   No results for input(s): LIPASE, AMYLASE in the last 168 hours. No results for input(s): AMMONIA in the last 168 hours. Coagulation Profile: No results for input(s): INR, PROTIME in the last 168 hours. Cardiac Enzymes: No results for input(s): CKTOTAL, CKMB, CKMBINDEX, TROPONINI in the last 168 hours. BNP (last 3 results) No results for input(s): PROBNP in the last 8760 hours. HbA1C: No results for input(s): HGBA1C in the last 72 hours. CBG: No results for input(s): GLUCAP in the last 168 hours. Lipid Profile: Recent Labs    12/14/19 1407  TRIG 227*   Thyroid Function Tests: No results for input(s): TSH, T4TOTAL, FREET4, T3FREE, THYROIDAB in the last 72 hours. Anemia Panel: Recent Labs    12/14/19 1407 12/15/19 0850  FERRITIN 508* 527*    Urine analysis:    Component Value Date/Time   COLORURINE YELLOW 12/14/2019 1620   APPEARANCEUR CLEAR 12/14/2019 1620   LABSPEC 1.014 12/14/2019 1620   PHURINE 5.0 12/14/2019 1620   GLUCOSEU NEGATIVE 12/14/2019 1620   HGBUR NEGATIVE 12/14/2019 1620   BILIRUBINUR NEGATIVE 12/14/2019 1620   KETONESUR NEGATIVE 12/14/2019 1620   PROTEINUR 100 (A) 12/14/2019 1620   NITRITE NEGATIVE 12/14/2019 1620   LEUKOCYTESUR NEGATIVE 12/14/2019 1620   Sepsis Labs: @LABRCNTIP (procalcitonin:4,lacticidven:4)  ) Recent Results (from the past 240 hour(s))  Blood Culture (routine x 2)     Status: None (Preliminary result)   Collection Time: 12/14/19  4:37 PM   Specimen: BLOOD RIGHT HAND  Result Value Ref Range Status   Specimen Description  BLOOD RIGHT HAND  Final   Special Requests   Final    BOTTLES DRAWN AEROBIC AND ANAEROBIC Blood Culture adequate volume   Culture   Final    NO GROWTH < 24 HOURS Performed at [facility], [address] [place], [place] [address]    Report Status PENDING  Incomplete  Blood Culture (routine x 2)     Status: None (Preliminary result)   Collection Time: 12/14/19  5:08 PM   Specimen: BLOOD  Result Value Ref Range Status   Specimen Description BLOOD RIGHT ANTECUBITAL  Final   Special Requests   Final    BOTTLES DRAWN AEROBIC AND ANAEROBIC Blood Culture adequate volume   Culture   Final    NO GROWTH < 24 HOURS Performed at [facility], [address], [place], [place] [address]    Report Status PENDING  Incomplete         Radiology Studies: DG Chest Port 1 View  Result Date: 12/14/2019 CLINICAL DATA:  Shortness of breath EXAM: PORTABLE CHEST 1 VIEW COMPARISON:  None FINDINGS: There is bilateral lower lobe and to lesser extent upper lobe airspace disease most concerning for multilobar pneumonia including atypical viral pneumonia. There is no pleural effusion or pneumothorax. The heart and mediastinal contours are unremarkable. There is no  acute osseous abnormality. IMPRESSION: Bilateral lower lobe and to lesser extent upper lobe airspace disease most concerning for multilobar pneumonia including atypical viral pneumonia. Electronically Signed   By: Elige Ko   On: 12/14/2019 14:21        Scheduled Meds: . dexamethasone (DECADRON) injection  6 mg Intravenous Q24H  . enoxaparin (LOVENOX) injection  40 mg Subcutaneous Q24H  . sodium chloride flush  3 mL Intravenous Q12H  . sodium chloride flush  3 mL Intravenous Q12H   Continuous Infusions: . sodium chloride    . remdesivir 100 mg in NS 100 mL 100 mg (12/15/19 1014)     LOS: 1 day   The patient is critically ill with multiple organ systems failure and requires high complexity decision making for assessment and support, frequent evaluation and titration of therapies, application of advanced monitoring technologies and extensive interpretation of multiple databases. Critical Care Time devoted to patient care services described in this note  Time spent: 40 minutes     Jasyn Mey, Roselind Messier, MD [facility] Pager [phone]  If 7PM-7AM, please contact night-coverage [email or web address] Password [facility] 12/15/2019, 11:52 AM

## 2019-12-15 NOTE — ED Notes (Signed)
Pt transported by [facility] to [facility]. Pt conscious, breathing, and A&Ox4. All belongings with pt. All paperwork given to EMS. No distress noted.

## 2019-12-16 LAB — HEMOGLOBIN A1C
Hgb A1c MFr Bld: 7.5 % — ABNORMAL HIGH (ref 4.8–5.6)
Mean Plasma Glucose: 168.55 mg/dL

## 2019-12-16 LAB — COMPREHENSIVE METABOLIC PANEL
ALT: 23 U/L (ref 0–44)
AST: 26 U/L (ref 15–41)
Albumin: 3.2 g/dL — ABNORMAL LOW (ref 3.5–5.0)
Alkaline Phosphatase: 68 U/L (ref 38–126)
Anion gap: 10 (ref 5–15)
BUN: 18 mg/dL (ref 8–23)
CO2: 26 mmol/L (ref 22–32)
Calcium: 9.1 mg/dL (ref 8.9–10.3)
Chloride: 103 mmol/L (ref 98–111)
Creatinine, Ser: 0.51 mg/dL (ref 0.44–1.00)
GFR calc Af Amer: >60 mL/min (ref >60)
GFR calc non Af Amer: >60 mL/min (ref >60)
Glucose, Bld: 307 mg/dL — ABNORMAL HIGH (ref 70–99)
Potassium: 4.1 mmol/L (ref 3.5–5.1)
Sodium: 139 mmol/L (ref 135–145)
Total Bilirubin: 0.5 mg/dL (ref 0.3–1.2)
Total Protein: 7.1 g/dL (ref 6.5–8.1)

## 2019-12-16 LAB — CBC WITH DIFFERENTIAL/PLATELET
Abs Immature Granulocytes: 0.01 10*3/uL (ref 0.00–0.07)
Basophils Absolute: 0 10*3/uL (ref 0.0–0.1)
Basophils Relative: 0 %
Eosinophils Absolute: 0 10*3/uL (ref 0.0–0.5)
Eosinophils Relative: 0 %
HCT: 32.8 % — ABNORMAL LOW (ref 36.0–46.0)
Hemoglobin: 10.7 g/dL — ABNORMAL LOW (ref 12.0–15.0)
Immature Granulocytes: 0 %
Lymphocytes Relative: 13 %
Lymphs Abs: 0.4 10*3/uL — ABNORMAL LOW (ref 0.7–4.0)
MCH: 27.6 pg (ref 26.0–34.0)
MCHC: 32.6 g/dL (ref 30.0–36.0)
MCV: 84.5 fL (ref 80.0–100.0)
Monocytes Absolute: 0.1 10*3/uL (ref 0.1–1.0)
Monocytes Relative: 3 %
Neutro Abs: 2.5 10*3/uL (ref 1.7–7.7)
Neutrophils Relative %: 84 %
Platelets: 300 10*3/uL (ref 150–400)
RBC: 3.88 MIL/uL (ref 3.87–5.11)
RDW: 13.3 % (ref 11.5–15.5)
WBC: 3 10*3/uL — ABNORMAL LOW (ref 4.0–10.5)
nRBC: 0 % (ref 0.0–0.2)

## 2019-12-16 LAB — PHOSPHORUS: Phosphorus: 2.4 mg/dL — ABNORMAL LOW (ref 2.5–4.6)

## 2019-12-16 LAB — LIPID PANEL
Cholesterol: 221 mg/dL — ABNORMAL HIGH (ref 0–200)
HDL: 37 mg/dL — ABNORMAL LOW (ref >40)
LDL Cholesterol: 166 mg/dL — ABNORMAL HIGH (ref 0–99)
Total CHOL/HDL Ratio: 6 RATIO
Triglycerides: 92 mg/dL (ref <150)
VLDL: 18 mg/dL (ref 0–40)

## 2019-12-16 LAB — BPAM FFP
Blood Product Expiration Date: 202101251851
ISSUE DATE / TIME: 202101241854
Unit Type and Rh: 7300

## 2019-12-16 LAB — GLUCOSE, CAPILLARY
Glucose-Capillary: 183 mg/dL — ABNORMAL HIGH (ref 70–99)
Glucose-Capillary: 205 mg/dL — ABNORMAL HIGH (ref 70–99)
Glucose-Capillary: 275 mg/dL — ABNORMAL HIGH (ref 70–99)
Glucose-Capillary: 294 mg/dL — ABNORMAL HIGH (ref 70–99)
Glucose-Capillary: 309 mg/dL — ABNORMAL HIGH (ref 70–99)
Glucose-Capillary: 379 mg/dL — ABNORMAL HIGH (ref 70–99)

## 2019-12-16 LAB — C-REACTIVE PROTEIN: CRP: 15.4 mg/dL — ABNORMAL HIGH (ref <1.0)

## 2019-12-16 LAB — D-DIMER, QUANTITATIVE: D-Dimer, Quant: 1.14 ug/mL-FEU — ABNORMAL HIGH (ref 0.00–0.50)

## 2019-12-16 LAB — PREPARE FRESH FROZEN PLASMA: Unit division: 0

## 2019-12-16 LAB — FERRITIN: Ferritin: 503 ng/mL — ABNORMAL HIGH (ref 11–307)

## 2019-12-16 LAB — MAGNESIUM: Magnesium: 2.1 mg/dL (ref 1.7–2.4)

## 2019-12-16 NOTE — Progress Notes (Signed)
IV team RN at bedside. Stated that ultrasound machine is not working properly. Rn attempted 3 times without success. IV team RN requested for a new order for a midline for tomorrow or later today because pt. Is tearful.

## 2019-12-16 NOTE — Progress Notes (Signed)
Inpatient Diabetes Program Recommendations  AACE/ADA: New Consensus Statement on Inpatient Glycemic Control (2015)  Target Ranges:  Prepandial:   less than 140 mg/dL      Peak postprandial:   less than 180 mg/dL (1-2 hours)      Critically ill patients:  140 - 180 mg/dL   Results for Carrie Black, Carrie Black (MRN [id number]) as of 12/16/2019 10:38  Ref. Range 12/15/2019 12:28 12/15/2019 16:04 12/15/2019 20:35 12/16/2019 00:02 12/16/2019 03:55 12/16/2019 07:58  Glucose-Capillary Latest Ref Range: 70 - 99 mg/dL 817 (H) 711 (H) 657 (H) 379 (H) 275 (H) 183 (H)  Results for Carrie Black, Carrie Black (MRN [id number]) as of 12/16/2019 10:38  Ref. Range 12/15/2019 08:50 12/16/2019 02:00  Hemoglobin A1C Latest Ref Range: 4.8 - 5.6 % 7.6 (H) 7.5 (H)   Review of Glycemic Control  Diabetes history: No Outpatient Diabetes medications: NA Current orders for Inpatient glycemic control: Novolog 0-15 units Q4H; Decadron 6 mg Q24H  Inpatient Diabetes Program Recommendations:   Insulin-If steroids are continued as ordered, please consider ordering Levemir 10 units BID.  A1C: A1C 7.6% on 12/15/19 indicating an average glucose of 171 mg/dl over the past 2-3 months. Noted patient has Prednisone taper on home medication list. MD, please indicate in your note if patient will be newly dx with DM at this time or advised to follow up with PCP regarding glycemic control and A1C.   Thanks, Orlando Penner, RN, MSN, CDE Diabetes Coordinator Inpatient Diabetes Program [phone] (Team Pager from 8am to 5pm)

## 2019-12-16 NOTE — Progress Notes (Signed)
Rn spoke with daughter on the phone. Updates given. No further questions.

## 2019-12-16 NOTE — Progress Notes (Signed)
PROGRESS NOTE    Carrie Black  YCX:[id number] DOB: 1956/11/13 DOA: 12/14/2019 PCP: System, Pcp Not In   Brief Narrative:  64 y.o.  Hispanic female (Spanish speaker), PMHx endometriosis  Presented with  Cough, fever up to 100.4 have been reporting CP and SOB since yesterday. to care and was noted to be satting 76% on RA was placed on 5 L if her oxygenation improved to 90%.  She reports fevers at home up to 101.5 she has tried to take ibuprofen to control her fever.  She has been overall sick for the past 2 weeks but tested only positive yesterday. She has been treated by provider with prednisone  And azithromycin for the past few days but without improvement The been seen in urgent care she was transported by EMS to [facility], ER    Subjective: 1/25 last 24 hours afebrile A/O x4, positive S OB   Assessment & Plan:   Active Problems:   Pneumonia due to COVID-19 virus   Hypokalemia   Acute respiratory disease due to COVID-19 virus   Acute respiratory failure with hypoxia (HCC)   Diabetes mellitus type 2, uncontrolled, with complications (HCC)   Covid pneumonia/acute respiratory failure with hypoxia COVID-19 Labs  Recent Labs    12/14/19 1407 12/15/19 0850 12/16/19 0200  DDIMER 1.08* 1.16* 1.14*  FERRITIN 508* 527* 503*  LDH 264*  --   --   CRP 19.9* 25.2* 15.4*   1/23 POC SARS coronavirus positive  -Decadron 6 mg daily -Remdesivir per pharmacy protocol -1/23 Actemra x1 dose -1/24 transfuse 1 unit Covid convalescent plasma -Combivent -Vitamins per Covid protocol -Titrate O2 to maintain SPO2> 88% -Prone patient 16 hours/day, if patient cannot tolerate prone 2 to 3 hours per shift  Diabetes type 2 uncontrolled with complication -1/25 Hemoglobin A1c= 7.5 -Moderate SSI -Diabetic coordinator -Nutrition diabetic education -Lipid panel pending   DVT prophylaxis: Lovenox Code Status: Full Family Communication: 1/25 spoke   Gualopie (Husband) counseled on plan of  care, answered all questions  Disposition Plan: TBD   Consultants:    Procedures/Significant Events:     I have personally reviewed and interpreted all radiology studies and my findings are as above.  VENTILATOR SETTINGS: NRB 1/25 Flow; 15 L/min SPO2; 91%   Cultures 1/22 Covid test positive 1/23 POC SARS coronavirus positive 1/23 sputum pending 1/23 blood pending  1/24 acute hepatitis panel pending    Antimicrobials: Anti-infectives (From admission, onward)   Start     Dose/Rate Stop   12/15/19 1000  remdesivir 100 mg in sodium chloride 0.9 % 100 mL IVPB     100 mg 200 mL/hr over 30 Minutes 12/19/19 0959   12/14/19 2000  remdesivir 200 mg in sodium chloride 0.9% 250 mL IVPB     200 mg 580 mL/hr over 30 Minutes 12/14/19 2024       Devices    LINES / TUBES:      Continuous Infusions: . sodium chloride    . remdesivir 100 mg in NS 100 mL 100 mg (12/16/19 0828)     Objective: Vitals:   12/16/19 0223 12/16/19 0353 12/16/19 0800 12/16/19 0900  BP: (!) 104/56 124/67  (!) 147/71  Pulse: 63 66  94  Resp: (!) 32 (!) 28 (!) 22 17  Temp: 98.7 F (37.1 C) 98.5 F (36.9 C) 98.5 F (36.9 C)   TempSrc: Oral Oral Oral   SpO2: 95% 91%  91%  Weight:      Height:  Intake/Output Summary (Last 24 hours) at 12/16/2019 1119 Last data filed at 12/16/2019 0222 Gross per 24 hour  Intake 820.42 ml  Output 500 ml  Net 320.42 ml   Filed Weights   12/14/19 1337  Weight: 63.5 kg   Physical Exam:  General: A/O x4, positive acute respiratory distress Eyes: negative scleral hemorrhage, negative anisocoria, negative icterus ENT: Negative Runny nose, negative gingival bleeding, Neck:  Negative scars, masses, torticollis, lymphadenopathy, JVD Lungs: Tachypneic creased breath sounds bilaterally without wheezes or crackles Cardiovascular: Regular rate and rhythm without murmur gallop or rub normal S1 and S2 Abdomen: negative abdominal pain, nondistended,  positive soft, bowel sounds, no rebound, no ascites, no appreciable mass Extremities: No significant cyanosis, clubbing, or edema bilateral lower extremities Skin: Negative rashes, lesions, ulcers Psychiatric:  Negative depression, negative anxiety, negative fatigue, negative mania  Central nervous system:  Cranial nerves II through XII intact, tongue/uvula midline, all extremities muscle strength 5/5, sensation intact throughout, negative dysarthria, negative expressive aphasia, negative receptive aphasia.      Data Reviewed: Care during the described time interval was provided by me .  I have reviewed this patient's available data, including medical history, events of note, physical examination, and all test results as part of my evaluation.   CBC: Recent Labs  Lab 12/14/19 1407 12/15/19 0850 12/16/19 0200  WBC 8.4 5.4 3.0*  NEUTROABS 7.1 4.6 2.5  HGB 12.3 11.9* 10.7*  HCT 37.3 35.5* 32.8*  MCV 82.7 83.3 84.5  PLT 258 297 314   Basic Metabolic Panel: Recent Labs  Lab 12/14/19 1407 12/14/19 1939 12/15/19 0850 12/16/19 0200  NA 137  --  139 139  K 3.2*  --  3.8 4.1  CL 101  --  106 103  CO2 23  --  23 26  GLUCOSE 169*  --  282* 307*  BUN 9  --  13 18  CREATININE 0.68  --  0.57 0.51  CALCIUM 8.8*  --  8.8* 9.1  MG  --  1.9 2.0 2.1  PHOS  --   --  2.8 2.4*   GFR: Estimated Creatinine Clearance: 61.5 mL/min (by C-G formula based on SCr of 0.51 mg/dL). Liver Function Tests: Recent Labs  Lab 12/14/19 1407 12/15/19 0850 12/16/19 0200  AST 35 32 26  ALT 25 26 23   ALKPHOS 79 75 68  BILITOT 0.8 0.4 0.5  PROT 7.1 7.3 7.1  ALBUMIN 3.2* 3.2* 3.2*   No results for input(s): LIPASE, AMYLASE in the last 168 hours. No results for input(s): AMMONIA in the last 168 hours. Coagulation Profile: No results for input(s): INR, PROTIME in the last 168 hours. Cardiac Enzymes: No results for input(s): CKTOTAL, CKMB, CKMBINDEX, TROPONINI in the last 168 hours. BNP (last 3  results) No results for input(s): PROBNP in the last 8760 hours. HbA1C: Recent Labs    12/15/19 0850 12/16/19 0200  HGBA1C 7.6* 7.5*   CBG: Recent Labs  Lab 12/15/19 1604 12/15/19 2035 12/16/19 0002 12/16/19 0355 12/16/19 0758  GLUCAP 232* 225* 379* 275* 183*   Lipid Profile: Recent Labs    12/14/19 1407 12/16/19 0200  CHOL  --  221*  HDL  --  37*  LDLCALC  --  166*  TRIG 227* 92  CHOLHDL  --  6.0   Thyroid Function Tests: No results for input(s): TSH, T4TOTAL, FREET4, T3FREE, THYROIDAB in the last 72 hours. Anemia Panel: Recent Labs    12/15/19 0850 12/16/19 0200  FERRITIN 527* 503*   Urine analysis:  Component Value Date/Time   COLORURINE YELLOW 12/14/2019 1620   APPEARANCEUR CLEAR 12/14/2019 1620   LABSPEC 1.014 12/14/2019 1620   PHURINE 5.0 12/14/2019 1620   GLUCOSEU NEGATIVE 12/14/2019 1620   HGBUR NEGATIVE 12/14/2019 1620   BILIRUBINUR NEGATIVE 12/14/2019 1620   KETONESUR NEGATIVE 12/14/2019 1620   PROTEINUR 100 (A) 12/14/2019 1620   NITRITE NEGATIVE 12/14/2019 1620   LEUKOCYTESUR NEGATIVE 12/14/2019 1620   Sepsis Labs: @LABRCNTIP (procalcitonin:4,lacticidven:4)  ) Recent Results (from the past 240 hour(s))  Blood Culture (routine x 2)     Status: None (Preliminary result)   Collection Time: 12/14/19  4:37 PM   Specimen: BLOOD RIGHT HAND  Result Value Ref Range Status   Specimen Description BLOOD RIGHT HAND  Final   Special Requests   Final    BOTTLES DRAWN AEROBIC AND ANAEROBIC Blood Culture adequate volume   Culture   Final    NO GROWTH 2 DAYS Performed at [facility], [address] [place], [place]    Report Status PENDING  Incomplete  Blood Culture (routine x 2)     Status: None (Preliminary result)   Collection Time: 12/14/19  5:08 PM   Specimen: BLOOD  Result Value Ref Range Status   Specimen Description BLOOD RIGHT ANTECUBITAL  Final   Special Requests   Final    BOTTLES DRAWN AEROBIC AND ANAEROBIC Blood  Culture adequate volume   Culture   Final    NO GROWTH 2 DAYS Performed at [facility], [address] [place], [place]    Report Status PENDING  Incomplete         Radiology Studies: DG Chest Port 1 View  Result Date: 12/14/2019 CLINICAL DATA:  Shortness of breath EXAM: PORTABLE CHEST 1 VIEW COMPARISON:  None FINDINGS: There is bilateral lower lobe and to lesser extent upper lobe airspace disease most concerning for multilobar pneumonia including atypical viral pneumonia. There is no pleural effusion or pneumothorax. The heart and mediastinal contours are unremarkable. There is no acute osseous abnormality. IMPRESSION: Bilateral lower lobe and to lesser extent upper lobe airspace disease most concerning for multilobar pneumonia including atypical viral pneumonia. Electronically Signed   By: 12/16/2019   On: 12/14/2019 14:21        Scheduled Meds: . vitamin C  500 mg Oral Daily  . dexamethasone (DECADRON) injection  6 mg Intravenous Q24H  . enoxaparin (LOVENOX) injection  40 mg Subcutaneous Q24H  . insulin aspart  0-15 Units Subcutaneous Q4H  . Ipratropium-Albuterol  1 puff Inhalation QID  . sodium chloride flush  3 mL Intravenous Q12H  . sodium chloride flush  3 mL Intravenous Q12H  . zinc sulfate  220 mg Oral Daily   Continuous Infusions: . sodium chloride    . remdesivir 100 mg in NS 100 mL 100 mg (12/16/19 0828)     LOS: 2 days   The patient is critically ill with multiple organ systems failure and requires high complexity decision making for assessment and support, frequent evaluation and titration of therapies, application of advanced monitoring technologies and extensive interpretation of multiple databases. Critical Care Time devoted to patient care services described in this note  Time spent: 40 minutes     Seletha Zimmermann, 12/18/19, MD [facility] Pager [phone]  If 7PM-7AM, please contact night-coverage [email or web address] Password  [facility] 12/16/2019, 11:19 AM

## 2019-12-16 NOTE — Plan of Care (Signed)
Patient status remained the same overnight, aox4, sats maintained well on NRB, attempted to weaned patient off of NRB, got up to use BSC and began to cough consistently causing sats to drop to 70's NRB resumed, Layed in Prone position approx. 6-7hr, received C. Plasma, safety precautions maintained, continue plan of care Problem: Education: Goal: Knowledge of risk factors and measures for prevention of condition will improve Outcome: Progressing   Problem: Coping: Goal: Psychosocial and spiritual needs will be supported Outcome: Progressing   Problem: Respiratory: Goal: Will maintain a patent airway Outcome: Progressing Goal: Complications related to the disease process, condition or treatment will be avoided or minimized Outcome: Progressing

## 2019-12-17 LAB — COMPREHENSIVE METABOLIC PANEL
ALT: 22 U/L (ref 0–44)
AST: 26 U/L (ref 15–41)
Albumin: 3.3 g/dL — ABNORMAL LOW (ref 3.5–5.0)
Alkaline Phosphatase: 66 U/L (ref 38–126)
Anion gap: 11 (ref 5–15)
BUN: 18 mg/dL (ref 8–23)
CO2: 27 mmol/L (ref 22–32)
Calcium: 9.4 mg/dL (ref 8.9–10.3)
Chloride: 103 mmol/L (ref 98–111)
Creatinine, Ser: 0.5 mg/dL (ref 0.44–1.00)
GFR calc Af Amer: >60 mL/min (ref >60)
GFR calc non Af Amer: >60 mL/min (ref >60)
Glucose, Bld: 215 mg/dL — ABNORMAL HIGH (ref 70–99)
Potassium: 4 mmol/L (ref 3.5–5.1)
Sodium: 141 mmol/L (ref 135–145)
Total Bilirubin: 0.2 mg/dL — ABNORMAL LOW (ref 0.3–1.2)
Total Protein: 7.1 g/dL (ref 6.5–8.1)

## 2019-12-17 LAB — CBC WITH DIFFERENTIAL/PLATELET
Abs Immature Granulocytes: 0.03 10*3/uL (ref 0.00–0.07)
Basophils Absolute: 0 10*3/uL (ref 0.0–0.1)
Basophils Relative: 0 %
Eosinophils Absolute: 0 10*3/uL (ref 0.0–0.5)
Eosinophils Relative: 0 %
HCT: 37.5 % (ref 36.0–46.0)
Hemoglobin: 12.1 g/dL (ref 12.0–15.0)
Immature Granulocytes: 0 %
Lymphocytes Relative: 11 %
Lymphs Abs: 0.8 10*3/uL (ref 0.7–4.0)
MCH: 27.3 pg (ref 26.0–34.0)
MCHC: 32.3 g/dL (ref 30.0–36.0)
MCV: 84.5 fL (ref 80.0–100.0)
Monocytes Absolute: 0.4 10*3/uL (ref 0.1–1.0)
Monocytes Relative: 6 %
Neutro Abs: 5.5 10*3/uL (ref 1.7–7.7)
Neutrophils Relative %: 83 %
Platelets: 420 10*3/uL — ABNORMAL HIGH (ref 150–400)
RBC: 4.44 MIL/uL (ref 3.87–5.11)
RDW: 13.2 % (ref 11.5–15.5)
WBC: 6.7 10*3/uL (ref 4.0–10.5)
nRBC: 0 % (ref 0.0–0.2)

## 2019-12-17 LAB — LIPID PANEL
Cholesterol: 233 mg/dL — ABNORMAL HIGH (ref 0–200)
HDL: 37 mg/dL — ABNORMAL LOW (ref >40)
LDL Cholesterol: 168 mg/dL — ABNORMAL HIGH (ref 0–99)
Total CHOL/HDL Ratio: 6.3 RATIO
Triglycerides: 140 mg/dL (ref <150)
VLDL: 28 mg/dL (ref 0–40)

## 2019-12-17 LAB — FERRITIN: Ferritin: 456 ng/mL — ABNORMAL HIGH (ref 11–307)

## 2019-12-17 LAB — GLUCOSE, CAPILLARY
Glucose-Capillary: 154 mg/dL — ABNORMAL HIGH (ref 70–99)
Glucose-Capillary: 159 mg/dL — ABNORMAL HIGH (ref 70–99)
Glucose-Capillary: 208 mg/dL — ABNORMAL HIGH (ref 70–99)
Glucose-Capillary: 217 mg/dL — ABNORMAL HIGH (ref 70–99)
Glucose-Capillary: 226 mg/dL — ABNORMAL HIGH (ref 70–99)
Glucose-Capillary: 252 mg/dL — ABNORMAL HIGH (ref 70–99)
Glucose-Capillary: 289 mg/dL — ABNORMAL HIGH (ref 70–99)

## 2019-12-17 LAB — MAGNESIUM: Magnesium: 2 mg/dL (ref 1.7–2.4)

## 2019-12-17 LAB — D-DIMER, QUANTITATIVE: D-Dimer, Quant: 1.14 ug/mL-FEU — ABNORMAL HIGH (ref 0.00–0.50)

## 2019-12-17 LAB — C-REACTIVE PROTEIN: CRP: 6.9 mg/dL — ABNORMAL HIGH (ref <1.0)

## 2019-12-17 LAB — PHOSPHORUS: Phosphorus: 3.9 mg/dL (ref 2.5–4.6)

## 2019-12-17 MED ORDER — INSULIN DETEMIR 100 UNIT/ML ~~LOC~~ SOLN
5.0000 [IU] | Freq: Every day | SUBCUTANEOUS | Status: DC
  Administered 2019-12-17 – 2019-12-18 (×2): 5 [IU] via SUBCUTANEOUS
  Filled 2019-12-17 (×2): qty 0.05

## 2019-12-17 MED ORDER — ALUM & MAG HYDROXIDE-SIMETH 200-200-20 MG/5ML PO SUSP
30.0000 mL | Freq: Four times a day (QID) | ORAL | Status: AC | PRN
  Administered 2019-12-17 – 2019-12-18 (×2): 30 mL via ORAL
  Filled 2019-12-17 (×2): qty 30

## 2019-12-17 MED ORDER — LIVING WELL WITH DIABETES BOOK - IN SPANISH
Freq: Once | Status: AC
  Filled 2019-12-17: qty 1

## 2019-12-17 NOTE — Progress Notes (Addendum)
Inpatient Diabetes Program Recommendations  AACE/ADA: New Consensus Statement on Inpatient Glycemic Control   Target Ranges:  Prepandial:   less than 140 mg/dL      Peak postprandial:   less than 180 mg/dL (1-2 hours)      Critically ill patients:  140 - 180 mg/dL   Results for LAILANI, TOOL (MRN [id number]) as of 12/17/2019 09:12  Ref. Range 12/16/2019 07:58 12/16/2019 11:17 12/16/2019 16:05 12/16/2019 20:14 12/16/2019 23:40 12/17/2019 04:21 12/17/2019 08:37  Glucose-Capillary Latest Ref Range: 70 - 99 mg/dL 237 (H) 628 (H) 315 (H) 309 (H) 289 (H) 208 (H) 154 (H)  Results for SHATARA, STANEK (MRN [id number]) as of 12/17/2019 09:12  Ref. Range 12/15/2019 08:50 12/16/2019 02:00  Hemoglobin A1C Latest Ref Range: 4.8 - 5.6 % 7.6 (H) 7.5 (H)   Review of Glycemic Control Diabetes history: No Outpatient Diabetes medications: NA Current orders for Inpatient glycemic control: Novolog 0-15 units Q4H; Decadron 6 mg Q24H  Inpatient Diabetes Program Recommendations:   Insulin- Over the past 24 hours, patient has received a total of Novolog 42 units  for correction.  If steroids are continued as ordered, please consider ordering Levemir 10 units BID.  A1C: A1C 7.6% on 12/15/19 indicating an average glucose of 171 mg/dl over the past 2-3 months.  MD, has noted that patient will be newly dx with DM during this admission.  NOTE:  MD notes patient will be newly dx with DM during this admission. Ordered Living Well with DM Spanish, RD consult for diet education, and patient education by bedside RNs. RN notes patient is short of breath and on high flow oxygen. Will plan to call patient to discuss new DM dx when appropriate.   Thanks, Orlando Penner, RN, MSN, CDE Diabetes Coordinator Inpatient Diabetes Program [phone] (Team Pager from 8am to 5pm)

## 2019-12-17 NOTE — Progress Notes (Signed)
Nutrition Consult - Diet Education  RD consulted for nutrition education regarding new onset diabetes. In depth diet education is not appropriate in current setting. Recommend outpatient DM education after D/C.   Lab Results  Component Value Date   HGBA1C 7.5 (H) 12/16/2019    RD attached "Carbohydrate Counting for People with Diabetes" Spanish handout from the Academy of Nutrition [employer] Dietetics to discharge instructions.   Body mass index is 26.45 kg/m.   Current diet order is heart healthy, patient is consuming approximately 75% of meals at this time. Labs and medications reviewed. No further nutrition interventions warranted at this time. RD contact information provided. If additional nutrition issues arise, please re-consult RD.   Joaquin Courts, RD, LDN, CNSC Pager [phone] After Hours Pager [phone]

## 2019-12-17 NOTE — Progress Notes (Signed)
PROGRESS NOTE    Carrie Black  YDX:[id number] DOB: 30-Oct-1956 DOA: 12/14/2019 PCP: System, Pcp Not In   Brief Narrative:  64 y.o.  Hispanic female (Spanish speaker), PMHx endometriosis  Presented with  Cough, fever up to 100.4 have been reporting CP and SOB since yesterday. to care and was noted to be satting 76% on RA was placed on 5 L if her oxygenation improved to 90%.  She reports fevers at home up to 101.5 she has tried to take ibuprofen to control her fever.  She has been overall sick for the past 2 weeks but tested only positive yesterday. She has been treated by provider with prednisone  And azithromycin for the past few days but without improvement The been seen in urgent care she was transported by EMS to [facility], ER    Subjective: 1/26 afebrile A/O x4, positive S OB, sitting in chair stating feels improved   Assessment & Plan:   Active Problems:   Pneumonia due to COVID-19 virus   Hypokalemia   Acute respiratory disease due to COVID-19 virus   Acute respiratory failure with hypoxia (HCC)   Diabetes mellitus type 2, uncontrolled, with complications (HCC)   Covid pneumonia/acute respiratory failure with hypoxia COVID-19 Labs  Recent Labs    12/14/19 1407 12/14/19 1407 12/15/19 0850 12/16/19 0200 12/17/19 0238  DDIMER 1.08*   < > 1.16* 1.14* 1.14*  FERRITIN 508*   < > 527* 503* 456*  LDH 264*  --   --   --   --   CRP 19.9*   < > 25.2* 15.4* 6.9*   < > = values in this interval not displayed.   1/23 POC SARS coronavirus positive  -Decadron 6 mg daily -Remdesivir per pharmacy protocol -1/23 Actemra x1 dose -1/24 transfuse 1 unit Covid convalescent plasma -Combivent -Vitamins per Covid protocol -Titrate O2 to maintain SPO2> 88% -Prone patient 16 hours/day, if patient cannot tolerate prone 2 to 3 hours per shift  Diabetes type 2 uncontrolled with complication -1/25 Hemoglobin A1c= 7.5 -1/26 Levemir 5 units  -Moderate SSI -Diabetic  coordinator -Nutrition diabetic education -Lipid panel pending   DVT prophylaxis: Lovenox Code Status: Full Family Communication: 1/25 spoke   Gualopie (Husband) counseled on plan of care, answered all questions  Disposition Plan: TBD   Consultants:    Procedures/Significant Events:     I have personally reviewed and interpreted all radiology studies and my findings are as above.  VENTILATOR SETTINGS: HFNC+ NRB 1/26 Flow; 15 L/min SPO2; 90%   Cultures 1/22 Covid test positive 1/23 POC SARS coronavirus positive 1/23 sputum pending 1/23 blood RIGHT hand NGTD  1/23 blood RIGHT AC NGTD 1/24 acute hepatitis panel negative    Antimicrobials: Anti-infectives (From admission, onward)   Start     Dose/Rate Stop   12/15/19 1000  remdesivir 100 mg in sodium chloride 0.9 % 100 mL IVPB     100 mg 200 mL/hr over 30 Minutes 12/19/19 0959   12/14/19 2000  remdesivir 200 mg in sodium chloride 0.9% 250 mL IVPB     200 mg 580 mL/hr over 30 Minutes 12/14/19 2024       Devices    LINES / TUBES:      Continuous Infusions: . sodium chloride    . remdesivir 100 mg in NS 100 mL 100 mg (12/17/19 7209)     Objective: Vitals:   12/17/19 0420 12/17/19 0819 12/17/19 0911 12/17/19 1000  BP: 118/83 120/70 120/70   Pulse: 68 60  79 72  Resp: (!) 28 (!) 26 (!) 22 (!) 29  Temp: 98.3 F (36.8 C) 98 F (36.7 C)    TempSrc: Oral Oral    SpO2: 92% 93% (!) 86% (!) 89%  Weight:      Height:        Intake/Output Summary (Last 24 hours) at 12/17/2019 1048 Last data filed at 12/17/2019 1000 Gross per 24 hour  Intake 540 ml  Output 525 ml  Net 15 ml   Filed Weights   12/14/19 1337  Weight: 63.5 kg   Physical Exam:  General: A/O x4, positive acute respiratory distress Eyes: negative scleral hemorrhage, negative anisocoria, negative icterus ENT: Negative Runny nose, negative gingival bleeding, Neck:  Negative scars, masses, torticollis, lymphadenopathy, JVD Lungs:  Tachypneic decreased breath sounds bilaterally without wheezes or crackles, dry cough with deep inspiration Cardiovascular: Regular rate and rhythm without murmur gallop or rub normal S1 and S2 Abdomen: negative abdominal pain, nondistended, positive soft, bowel sounds, no rebound, no ascites, no appreciable mass Extremities: No significant cyanosis, clubbing, or edema bilateral lower extremities Skin: Negative rashes, lesions, ulcers Psychiatric:  Negative depression, negative anxiety, negative fatigue, negative mania  Central nervous system:  Cranial nerves II through XII intact, tongue/uvula midline, all extremities muscle strength 5/5, sensation intact throughout, negative dysarthria, negative expressive aphasia, negative receptive aphasia.      Data Reviewed: Care during the described time interval was provided by me .  I have reviewed this patient's available data, including medical history, events of note, physical examination, and all test results as part of my evaluation.   CBC: Recent Labs  Lab 12/14/19 1407 12/15/19 0850 12/16/19 0200 12/17/19 0238  WBC 8.4 5.4 3.0* 6.7  NEUTROABS 7.1 4.6 2.5 5.5  HGB 12.3 11.9* 10.7* 12.1  HCT 37.3 35.5* 32.8* 37.5  MCV 82.7 83.3 84.5 84.5  PLT 258 297 300 409*   Basic Metabolic Panel: Recent Labs  Lab 12/14/19 1407 12/14/19 1939 12/15/19 0850 12/16/19 0200 12/17/19 0238  NA 137  --  139 139 141  K 3.2*  --  3.8 4.1 4.0  CL 101  --  106 103 103  CO2 23  --  23 26 27   GLUCOSE 169*  --  282* 307* 215*  BUN 9  --  13 18 18   CREATININE 0.68  --  0.57 0.51 0.50  CALCIUM 8.8*  --  8.8* 9.1 9.4  MG  --  1.9 2.0 2.1 2.0  PHOS  --   --  2.8 2.4* 3.9   GFR: Estimated Creatinine Clearance: 61.5 mL/min (by C-G formula based on SCr of 0.5 mg/dL). Liver Function Tests: Recent Labs  Lab 12/14/19 1407 12/15/19 0850 12/16/19 0200 12/17/19 0238  AST 35 32 26 26  ALT 25 26 23 22   ALKPHOS 79 75 68 66  BILITOT 0.8 0.4 0.5 0.2*  PROT  7.1 7.3 7.1 7.1  ALBUMIN 3.2* 3.2* 3.2* 3.3*   No results for input(s): LIPASE, AMYLASE in the last 168 hours. No results for input(s): AMMONIA in the last 168 hours. Coagulation Profile: No results for input(s): INR, PROTIME in the last 168 hours. Cardiac Enzymes: No results for input(s): CKTOTAL, CKMB, CKMBINDEX, TROPONINI in the last 168 hours. BNP (last 3 results) No results for input(s): PROBNP in the last 8760 hours. HbA1C: Recent Labs    12/15/19 0850 12/16/19 0200  HGBA1C 7.6* 7.5*   CBG: Recent Labs  Lab 12/16/19 1605 12/16/19 2014 12/16/19 2340 12/17/19 0421 12/17/19 7353  GLUCAP 205* 309* 289* 208* 154*   Lipid Profile: Recent Labs    12/16/19 0200 12/17/19 0238  CHOL 221* 233*  HDL 37* 37*  LDLCALC 166* 425*  TRIG 92 140  CHOLHDL 6.0 6.3   Thyroid Function Tests: No results for input(s): TSH, T4TOTAL, FREET4, T3FREE, THYROIDAB in the last 72 hours. Anemia Panel: Recent Labs    12/16/19 0200 12/17/19 0238  FERRITIN 503* 456*   Urine analysis:    Component Value Date/Time   COLORURINE YELLOW 12/14/2019 1620   APPEARANCEUR CLEAR 12/14/2019 1620   LABSPEC 1.014 12/14/2019 1620   PHURINE 5.0 12/14/2019 1620   GLUCOSEU NEGATIVE 12/14/2019 1620   HGBUR NEGATIVE 12/14/2019 1620   BILIRUBINUR NEGATIVE 12/14/2019 1620   KETONESUR NEGATIVE 12/14/2019 1620   PROTEINUR 100 (A) 12/14/2019 1620   NITRITE NEGATIVE 12/14/2019 1620   LEUKOCYTESUR NEGATIVE 12/14/2019 1620   Sepsis Labs: @LABRCNTIP (procalcitonin:4,lacticidven:4)  ) Recent Results (from the past 240 hour(s))  Blood Culture (routine x 2)     Status: None (Preliminary result)   Collection Time: 12/14/19  4:37 PM   Specimen: BLOOD RIGHT HAND  Result Value Ref Range Status   Specimen Description BLOOD RIGHT HAND  Final   Special Requests   Final    BOTTLES DRAWN AEROBIC AND ANAEROBIC Blood Culture adequate volume   Culture   Final    NO GROWTH 3 DAYS Performed at [facility]  [facility], [address], [place], [place]    Report Status PENDING  Incomplete  Blood Culture (routine x 2)     Status: None (Preliminary result)   Collection Time: 12/14/19  5:08 PM   Specimen: BLOOD  Result Value Ref Range Status   Specimen Description BLOOD RIGHT ANTECUBITAL  Final   Special Requests   Final    BOTTLES DRAWN AEROBIC AND ANAEROBIC Blood Culture adequate volume   Culture   Final    NO GROWTH 3 DAYS Performed at [facility], [address] [place], [place]    Report Status PENDING  Incomplete         Radiology Studies: No results found.      Scheduled Meds: . vitamin C  500 mg Oral Daily  . dexamethasone (DECADRON) injection  6 mg Intravenous Q24H  . enoxaparin (LOVENOX) injection  40 mg Subcutaneous Q24H  . insulin aspart  0-15 Units Subcutaneous Q4H  . Ipratropium-Albuterol  1 puff Inhalation QID  . sodium chloride flush  3 mL Intravenous Q12H  . sodium chloride flush  3 mL Intravenous Q12H  . zinc sulfate  220 mg Oral Daily   Continuous Infusions: . sodium chloride    . remdesivir 100 mg in NS 100 mL 100 mg (12/17/19 0836)     LOS: 3 days   The patient is critically ill with multiple organ systems failure and requires high complexity decision making for assessment and support, frequent evaluation and titration of therapies, application of advanced monitoring technologies and extensive interpretation of multiple databases. Critical Care Time devoted to patient care services described in this note  Time spent: 40 minutes     Terese Heier, 12/19/19, MD [facility] Pager [phone]  If 7PM-7AM, please contact night-coverage [email or web address] Password [facility] 12/17/2019, 10:48 AM

## 2019-12-17 NOTE — Plan of Care (Signed)
Newly diagnosed diabetic, started on booklet learning provided by pharmacy. Diabetic coordinator to discuss treatment plan. Pt currently on 100% non rebreather and 15 liters hiflow O2 delivery with current spo2 of 93%. Very short of breath with minimal activity, coughing frequently with small amt of pale secretion production. Currently complaining of heart burn and unable to eat lunch, maalox prn ordered, will give upon verification from pharmacist.

## 2019-12-17 NOTE — Plan of Care (Signed)
Patient showed improvement overnight, mental status remains unchanged, vitals wnl, was able to be removed from NRB around 2200 and placed on 15LHF with sats >88 majority 90's,  prone from 2200 to 0300, transferring from bed to bedside commode better than previously noted, CBG trending down overnight, continue plan of care Problem: Education: Goal: Knowledge of risk factors and measures for prevention of condition will improve Outcome: Progressing   Problem: Coping: Goal: Psychosocial and spiritual needs will be supported Outcome: Progressing   Problem: Respiratory: Goal: Will maintain a patent airway Outcome: Progressing Goal: Complications related to the disease process, condition or treatment will be avoided or minimized Outcome: Progressing

## 2019-12-18 DIAGNOSIS — E118 Type 2 diabetes mellitus with unspecified complications: Secondary | ICD-10-CM

## 2019-12-18 DIAGNOSIS — E1165 Type 2 diabetes mellitus with hyperglycemia: Secondary | ICD-10-CM

## 2019-12-18 LAB — COMPREHENSIVE METABOLIC PANEL
ALT: 25 U/L (ref 0–44)
AST: 27 U/L (ref 15–41)
Albumin: 3.3 g/dL — ABNORMAL LOW (ref 3.5–5.0)
Alkaline Phosphatase: 66 U/L (ref 38–126)
Anion gap: 12 (ref 5–15)
BUN: 21 mg/dL (ref 8–23)
CO2: 27 mmol/L (ref 22–32)
Calcium: 9.2 mg/dL (ref 8.9–10.3)
Chloride: 101 mmol/L (ref 98–111)
Creatinine, Ser: 0.57 mg/dL (ref 0.44–1.00)
GFR calc Af Amer: >60 mL/min (ref >60)
GFR calc non Af Amer: >60 mL/min (ref >60)
Glucose, Bld: 197 mg/dL — ABNORMAL HIGH (ref 70–99)
Potassium: 3.9 mmol/L (ref 3.5–5.1)
Sodium: 140 mmol/L (ref 135–145)
Total Bilirubin: 0.9 mg/dL (ref 0.3–1.2)
Total Protein: 7 g/dL (ref 6.5–8.1)

## 2019-12-18 LAB — CBC WITH DIFFERENTIAL/PLATELET
Abs Immature Granulocytes: 0.04 10*3/uL (ref 0.00–0.07)
Basophils Absolute: 0 10*3/uL (ref 0.0–0.1)
Basophils Relative: 0 %
Eosinophils Absolute: 0 10*3/uL (ref 0.0–0.5)
Eosinophils Relative: 0 %
HCT: 37.5 % (ref 36.0–46.0)
Hemoglobin: 12.4 g/dL (ref 12.0–15.0)
Immature Granulocytes: 1 %
Lymphocytes Relative: 9 %
Lymphs Abs: 0.7 10*3/uL (ref 0.7–4.0)
MCH: 27.6 pg (ref 26.0–34.0)
MCHC: 33.1 g/dL (ref 30.0–36.0)
MCV: 83.3 fL (ref 80.0–100.0)
Monocytes Absolute: 0.3 10*3/uL (ref 0.1–1.0)
Monocytes Relative: 3 %
Neutro Abs: 7.1 10*3/uL (ref 1.7–7.7)
Neutrophils Relative %: 87 %
Platelets: 452 10*3/uL — ABNORMAL HIGH (ref 150–400)
RBC: 4.5 MIL/uL (ref 3.87–5.11)
RDW: 13.1 % (ref 11.5–15.5)
WBC: 8.1 10*3/uL (ref 4.0–10.5)
nRBC: 0 % (ref 0.0–0.2)

## 2019-12-18 LAB — GLUCOSE, CAPILLARY
Glucose-Capillary: 261 mg/dL — ABNORMAL HIGH (ref 70–99)
Glucose-Capillary: 195 mg/dL — ABNORMAL HIGH (ref 70–99)
Glucose-Capillary: 162 mg/dL — ABNORMAL HIGH (ref 70–99)
Glucose-Capillary: 285 mg/dL — ABNORMAL HIGH (ref 70–99)
Glucose-Capillary: 190 mg/dL — ABNORMAL HIGH (ref 70–99)
Glucose-Capillary: 336 mg/dL — ABNORMAL HIGH (ref 70–99)

## 2019-12-18 LAB — MAGNESIUM: Magnesium: 2.2 mg/dL (ref 1.7–2.4)

## 2019-12-18 LAB — D-DIMER, QUANTITATIVE: D-Dimer, Quant: 1.18 ug/mL-FEU — ABNORMAL HIGH (ref 0.00–0.50)

## 2019-12-18 LAB — C-REACTIVE PROTEIN: CRP: 3.1 mg/dL — ABNORMAL HIGH (ref <1.0)

## 2019-12-18 LAB — PHOSPHORUS: Phosphorus: 3.6 mg/dL (ref 2.5–4.6)

## 2019-12-18 LAB — FERRITIN: Ferritin: 361 ng/mL — ABNORMAL HIGH (ref 11–307)

## 2019-12-18 MED ORDER — INSULIN ASPART 100 UNIT/ML ~~LOC~~ SOLN
0.0000 [IU] | Freq: Every day | SUBCUTANEOUS | Status: DC
  Administered 2019-12-18 – 2019-12-19 (×2): 4 [IU] via SUBCUTANEOUS
  Administered 2019-12-20 – 2019-12-21 (×2): 3 [IU] via SUBCUTANEOUS
  Administered 2019-12-22 – 2019-12-23 (×2): 5 [IU] via SUBCUTANEOUS
  Administered 2019-12-24: 3 [IU] via SUBCUTANEOUS

## 2019-12-18 MED ORDER — INSULIN DETEMIR 100 UNIT/ML ~~LOC~~ SOLN
10.0000 [IU] | Freq: Two times a day (BID) | SUBCUTANEOUS | Status: DC
  Administered 2019-12-18 – 2019-12-19 (×2): 10 [IU] via SUBCUTANEOUS
  Filled 2019-12-18 (×3): qty 0.1

## 2019-12-18 MED ORDER — LINAGLIPTIN 5 MG PO TABS
5.0000 mg | ORAL_TABLET | Freq: Every day | ORAL | Status: DC
  Administered 2019-12-19 – 2019-12-25 (×8): 5 mg via ORAL
  Filled 2019-12-18 (×7): qty 1

## 2019-12-18 MED ORDER — INSULIN ASPART 100 UNIT/ML ~~LOC~~ SOLN
0.0000 [IU] | Freq: Three times a day (TID) | SUBCUTANEOUS | Status: DC
  Administered 2019-12-18: 2 [IU] via SUBCUTANEOUS
  Administered 2019-12-19: 5 [IU] via SUBCUTANEOUS
  Administered 2019-12-19 – 2019-12-20 (×4): 3 [IU] via SUBCUTANEOUS
  Administered 2019-12-20: 2 [IU] via SUBCUTANEOUS
  Administered 2019-12-21: 5 [IU] via SUBCUTANEOUS
  Administered 2019-12-21: 2 [IU] via SUBCUTANEOUS
  Administered 2019-12-21: 3 [IU] via SUBCUTANEOUS
  Administered 2019-12-22: 5 [IU] via SUBCUTANEOUS
  Administered 2019-12-22 – 2019-12-23 (×4): 2 [IU] via SUBCUTANEOUS
  Administered 2019-12-23: 7 [IU] via SUBCUTANEOUS
  Administered 2019-12-24: 2 [IU] via SUBCUTANEOUS
  Administered 2019-12-24: 5 [IU] via SUBCUTANEOUS
  Administered 2019-12-24 – 2019-12-25 (×2): 3 [IU] via SUBCUTANEOUS
  Administered 2019-12-25: 2 [IU] via SUBCUTANEOUS
  Administered 2019-12-26: 1 [IU] via SUBCUTANEOUS

## 2019-12-18 NOTE — Plan of Care (Signed)
Patient status remains unchanged removed NRB @ sos and used only 15 HF sats maintained >88% most of shift dropping when up to bsc with coughing spells, cbgs progressing, complained of headache when coughing, given prn tylenol, proned only briefly this shift, continue plan of care Problem: Education: Goal: Knowledge of risk factors and measures for prevention of condition will improve Outcome: Progressing   Problem: Coping: Goal: Psychosocial and spiritual needs will be supported Outcome: Progressing   Problem: Respiratory: Goal: Will maintain a patent airway Outcome: Progressing Goal: Complications related to the disease process, condition or treatment will be avoided or minimized Outcome: Progressing   Problem: Education: Goal: Ability to describe self-care measures that may prevent or decrease complications (Diabetes Survival Skills Education) will improve Outcome: Progressing Goal: Individualized Educational Video(s) Outcome: Progressing   Problem: Coping: Goal: Ability to adjust to condition or change in health will improve Outcome: Progressing   Problem: Fluid Volume: Goal: Ability to maintain a balanced intake and output will improve Outcome: Progressing   Problem: Health Behavior/Discharge Planning: Goal: Ability to identify and utilize available resources and services will improve Outcome: Progressing Goal: Ability to manage health-related needs will improve Outcome: Progressing   Problem: Metabolic: Goal: Ability to maintain appropriate glucose levels will improve Outcome: Progressing   Problem: Nutritional: Goal: Maintenance of adequate nutrition will improve Outcome: Progressing Goal: Progress toward achieving an optimal weight will improve Outcome: Progressing   Problem: Skin Integrity: Goal: Risk for impaired skin integrity will decrease Outcome: Completed/Met   Problem: Tissue Perfusion: Goal: Adequacy of tissue perfusion will improve Outcome:  Completed/Met

## 2019-12-18 NOTE — Progress Notes (Addendum)
Inpatient Diabetes Program Recommendations  AACE/ADA: New Consensus Statement on Inpatient Glycemic Control (2015)  Target Ranges:  Prepandial:   less than 140 mg/dL      Peak postprandial:   less than 180 mg/dL (1-2 hours)      Critically ill patients:  140 - 180 mg/dL   Lab Results  Component Value Date   GLUCAP 162 (H) 12/18/2019   HGBA1C 7.5 (H) 12/16/2019   Results for KARIN, PINEDO (MRN [id number]) as of 12/18/2019 09:13  Ref. Range 12/17/2019 19:39 12/18/2019 00:39 12/18/2019 03:21 12/18/2019 08:40  Glucose-Capillary Latest Ref Range: 70 - 99 mg/dL 259 (H) 563 (H) 875 (H) 162 (H)   Review of Glycemic Control Diabetes history:No Outpatient Diabetes medications:NA Current orders for Inpatient glycemic control:Novolog 0-15 units Q4H; Decadron 6 mg Q24H, Levemir 5 units QD  Inpatient Diabetes Program Recommendations:  Insulin- Over the past 24 hours, patient has received a total of Novolog 37 units  for correction.  If steroids are continued as ordered, please consider increasing Levemir to 10 units BID.  Also, since patient is tolerating oral intake, consider switching correction to Novolog 0-15 units TID & Hs.   Thanks, Lujean Rave, MSN, RNC-OB Diabetes Coordinator [phone] (8a-5p)

## 2019-12-18 NOTE — Plan of Care (Signed)
Continuing with 15 liters hiflo with humidity and intermittent 100% non rebreather during activities. Respiratory is shallow and periodically tachypneic, IS and acapella valve in use independently. Despite high O2 demands, pt shows improvement in activity and overall energy level.  Problem: Education: Goal: Knowledge of risk factors and measures for prevention of condition will improve 12/18/2019 1526 by Sunnie Nielsen, RN Outcome: Progressing 12/18/2019 1415 by Sunnie Nielsen, RN Outcome: Progressing   Problem: Coping: Goal: Psychosocial and spiritual needs will be supported 12/18/2019 1526 by Sunnie Nielsen, RN Outcome: Progressing 12/18/2019 1415 by Sunnie Nielsen, RN Outcome: Progressing   Problem: Respiratory: Goal: Will maintain a patent airway 12/18/2019 1526 by Sunnie Nielsen, RN Outcome: Progressing 12/18/2019 1415 by Sunnie Nielsen, RN Outcome: Progressing Goal: Complications related to the disease process, condition or treatment will be avoided or minimized 12/18/2019 1526 by Sunnie Nielsen, RN Outcome: Progressing 12/18/2019 1415 by Sunnie Nielsen, RN Outcome: Progressing   Problem: Education: Goal: Ability to describe self-care measures that may prevent or decrease complications (Diabetes Survival Skills Education) will improve 12/18/2019 1526 by Sunnie Nielsen, RN Outcome: Progressing 12/18/2019 1415 by Sunnie Nielsen, RN Outcome: Progressing Goal: Individualized Educational Video(s) 12/18/2019 1526 by Sunnie Nielsen, RN Outcome: Progressing 12/18/2019 1415 by Sunnie Nielsen, RN Outcome: Progressing   Problem: Coping: Goal: Ability to adjust to condition or change in health will improve 12/18/2019 1526 by Sunnie Nielsen, RN Outcome: Progressing 12/18/2019 1415 by Sunnie Nielsen, RN Outcome: Progressing   Problem: Fluid Volume: Goal: Ability to maintain a balanced intake and output will improve 12/18/2019 1526 by Sunnie Nielsen, RN Outcome: Progressing 12/18/2019 1415  by Sunnie Nielsen, RN Outcome: Progressing   Problem: Health Behavior/Discharge Planning: Goal: Ability to identify and utilize available resources and services will improve 12/18/2019 1526 by Sunnie Nielsen, RN Outcome: Progressing 12/18/2019 1415 by Sunnie Nielsen, RN Outcome: Progressing Goal: Ability to manage health-related needs will improve 12/18/2019 1526 by Sunnie Nielsen, RN Outcome: Progressing 12/18/2019 1415 by Sunnie Nielsen, RN Outcome: Progressing   Problem: Metabolic: Goal: Ability to maintain appropriate glucose levels will improve 12/18/2019 1526 by Sunnie Nielsen, RN Outcome: Progressing 12/18/2019 1415 by Sunnie Nielsen, RN Outcome: Progressing   Problem: Nutritional: Goal: Maintenance of adequate nutrition will improve 12/18/2019 1526 by Sunnie Nielsen, RN Outcome: Progressing 12/18/2019 1415 by Sunnie Nielsen, RN Outcome: Progressing Goal: Progress toward achieving an optimal weight will improve 12/18/2019 1526 by Sunnie Nielsen, RN Outcome: Progressing 12/18/2019 1415 by Sunnie Nielsen, RN Outcome: Progressing

## 2019-12-18 NOTE — Progress Notes (Signed)
Carrie Black  LKJ:[id number] DOB: 11/09/1956 DOA: 12/14/2019 PCP: System, Pcp Not In    Brief Narrative:  64 year old with a history of endometriosis who presented with a cough and fever of 2 weeks duration and was found to have oxygen saturations of 76% on room air.  Significant Events: 1/23 admit via [place] -transferred to [facility] - COVID+  COVID-19 specific Treatment: Decadron 1/23 > Remdesivir 1/23 > 1/27 Actemra 1/23 Convalescent plasma on 1/24  Antimicrobials:  none  Subjective: Continues to require high level oxygen support at 15 L of high flow nasal cannula.  States that she feels slightly less short of breath today.  Denies chest pain fevers chills nausea or vomiting.  Reports a poor appetite.  Assessment & Plan:  COVID Pneumonia -acute hypoxic respiratory failure Continue Decadron -has completed a course of remdesivir -was dosed with Actemra and convalescent plasma -attempt to gently diurese  Recent Labs  Lab 12/14/19 1407 12/15/19 0850 12/16/19 0200 12/17/19 0238 12/18/19 0240 12/18/19 0255  DDIMER 1.08* 1.16* 1.14* 1.14*  --  1.18*  FERRITIN 508* 527* 503* 456* 361*  --   CRP 19.9* 25.2* 15.4* 6.9* 3.1*  --   ALT 25 26 23 22   --  25  PROCALCITON <0.10  --   --   --   --   --     Newly diagnosed DM 2 A1c 7.5 -CBG variable with ongoing use of steroid -adjust insulin and follow  DVT prophylaxis: Lovenox Code Status: FULL CODE Family Communication:  Disposition Plan:   Consultants:  none  Objective: Blood pressure 131/71, pulse 80, temperature 98.3 F (36.8 C), temperature source Axillary, resp. rate (!) 26, height 5\' 1"  (1.549 m), weight 63.5 kg, SpO2 92 %.  Intake/Output Summary (Last 24 hours) at 12/18/2019 1007 Last data filed at 12/18/2019 0914 Gross per 24 hour  Intake 520 ml  Output 1200 ml  Net -680 ml   Filed Weights   12/14/19 1337  Weight: 63.5 kg    Examination: General: Mild distress but able to complete full sentences with  pausing between Lungs: Fine crackles diffusely but no wheezing Cardiovascular: Regular rate and rhythm without murmur gallop or rub normal S1 and S2 Abdomen: Nontender, nondistended, soft, bowel sounds positive, no rebound, no ascites, no appreciable mass Extremities: No significant cyanosis, clubbing, or edema bilateral lower extremities  CBC: Recent Labs  Lab 12/16/19 0200 12/17/19 0238 12/18/19 0255  WBC 3.0* 6.7 8.1  NEUTROABS 2.5 5.5 7.1  HGB 10.7* 12.1 12.4  HCT 32.8* 37.5 37.5  MCV 84.5 84.5 83.3  PLT 300 420* 452*   Basic Metabolic Panel: Recent Labs  Lab 12/16/19 0200 12/17/19 0238 12/18/19 0255  NA 139 141 140  K 4.1 4.0 3.9  CL 103 103 101  CO2 26 27 27   GLUCOSE 307* 215* 197*  BUN 18 18 21   CREATININE 0.51 0.50 0.57  CALCIUM 9.1 9.4 9.2  MG 2.1 2.0 2.2  PHOS 2.4* 3.9 3.6   GFR: Estimated Creatinine Clearance: 61.5 mL/min (by C-G formula based on SCr of 0.57 mg/dL).  Liver Function Tests: Recent Labs  Lab 12/15/19 0850 12/16/19 0200 12/17/19 0238 12/18/19 0255  AST 32 26 26 27   ALT 26 23 22 25   ALKPHOS 75 68 66 66  BILITOT 0.4 0.5 0.2* 0.9  PROT 7.3 7.1 7.1 7.0  ALBUMIN 3.2* 3.2* 3.3* 3.3*    HbA1C: Hgb A1c MFr Bld  Date/Time Value Ref Range Status  12/16/2019 02:00 AM 7.5 (H)  4.8 - 5.6 % Final    Comment:    (NOTE) Pre diabetes:          5.7%-6.4% Diabetes:              >6.4% Glycemic control for   <7.0% adults with diabetes   12/15/2019 08:50 AM 7.6 (H) 4.8 - 5.6 % Final    Comment:    (NOTE) Pre diabetes:          5.7%-6.4% Diabetes:              >6.4% Glycemic control for   <7.0% adults with diabetes     CBG: Recent Labs  Lab 12/17/19 1613 12/17/19 1939 12/18/19 0039 12/18/19 0321 12/18/19 0840  GLUCAP 159* 252* 261* 195* 162*    Recent Results (from the past 240 hour(s))  Blood Culture (routine x 2)     Status: None (Preliminary result)   Collection Time: 12/14/19  4:37 PM   Specimen: BLOOD RIGHT HAND  Result  Value Ref Range Status   Specimen Description BLOOD RIGHT HAND  Final   Special Requests   Final    BOTTLES DRAWN AEROBIC AND ANAEROBIC Blood Culture adequate volume   Culture   Final    NO GROWTH 3 DAYS Performed at [facility], [place] [address], [place], [place] [address]    Report Status PENDING  Incomplete  Blood Culture (routine x 2)     Status: None (Preliminary result)   Collection Time: 12/14/19  5:08 PM   Specimen: BLOOD  Result Value Ref Range Status   Specimen Description BLOOD RIGHT ANTECUBITAL  Final   Special Requests   Final    BOTTLES DRAWN AEROBIC AND ANAEROBIC Blood Culture adequate volume   Culture   Final    NO GROWTH 3 DAYS Performed at [facility], [place] [address], [place], [place] [address]    Report Status PENDING  Incomplete     Scheduled Meds: . vitamin C  500 mg Oral Daily  . dexamethasone (DECADRON) injection  6 mg Intravenous Q24H  . enoxaparin (LOVENOX) injection  40 mg Subcutaneous Q24H  . insulin aspart  0-15 Units Subcutaneous Q4H  . insulin detemir  5 Units Subcutaneous Daily  . Ipratropium-Albuterol  1 puff Inhalation QID  . sodium chloride flush  3 mL Intravenous Q12H  . sodium chloride flush  3 mL Intravenous Q12H  . zinc sulfate  220 mg Oral Daily   Continuous Infusions: . sodium chloride       LOS: 4 days   Cherene Altes, MD [facility] Office  [phone] Pager - Text Page per Shea Evans  If 7PM-7AM, please contact night-coverage per Amion 12/18/2019, 10:07 AM

## 2019-12-19 LAB — GLUCOSE, CAPILLARY
Glucose-Capillary: 213 mg/dL — ABNORMAL HIGH (ref 70–99)
Glucose-Capillary: 231 mg/dL — ABNORMAL HIGH (ref 70–99)
Glucose-Capillary: 300 mg/dL — ABNORMAL HIGH (ref 70–99)
Glucose-Capillary: 320 mg/dL — ABNORMAL HIGH (ref 70–99)
Glucose-Capillary: 361 mg/dL — ABNORMAL HIGH (ref 70–99)

## 2019-12-19 LAB — CULTURE, BLOOD (ROUTINE X 2)
Culture: NO GROWTH
Culture: NO GROWTH
Special Requests: ADEQUATE
Special Requests: ADEQUATE

## 2019-12-19 MED ORDER — INSULIN DETEMIR 100 UNIT/ML ~~LOC~~ SOLN
14.0000 [IU] | Freq: Two times a day (BID) | SUBCUTANEOUS | Status: DC
  Administered 2019-12-19 – 2019-12-20 (×2): 14 [IU] via SUBCUTANEOUS
  Filled 2019-12-19 (×3): qty 0.14

## 2019-12-19 MED ORDER — FUROSEMIDE 10 MG/ML IJ SOLN
40.0000 mg | Freq: Every day | INTRAMUSCULAR | Status: DC
  Administered 2019-12-20: 40 mg via INTRAVENOUS
  Filled 2019-12-19 (×2): qty 4

## 2019-12-19 MED ORDER — ALUM & MAG HYDROXIDE-SIMETH 200-200-20 MG/5ML PO SUSP
15.0000 mL | ORAL | Status: DC | PRN
  Administered 2019-12-19: 30 mL via ORAL
  Filled 2019-12-19: qty 30

## 2019-12-19 MED ORDER — INSULIN ASPART 100 UNIT/ML ~~LOC~~ SOLN
3.0000 [IU] | Freq: Once | SUBCUTANEOUS | Status: AC
  Administered 2019-12-19: 3 [IU] via SUBCUTANEOUS

## 2019-12-19 MED ORDER — FUROSEMIDE 10 MG/ML IJ SOLN
40.0000 mg | Freq: Once | INTRAMUSCULAR | Status: AC
  Administered 2019-12-19: 40 mg via INTRAVENOUS
  Filled 2019-12-19: qty 4

## 2019-12-19 MED ORDER — PANTOPRAZOLE SODIUM 40 MG PO TBEC
40.0000 mg | DELAYED_RELEASE_TABLET | Freq: Every day | ORAL | Status: DC
  Administered 2019-12-19 – 2019-12-26 (×8): 40 mg via ORAL
  Filled 2019-12-19 (×8): qty 1

## 2019-12-19 NOTE — Progress Notes (Signed)
Occupational Therapy Evaluation Patient Details Name: Carrie Black MRN: [id number] DOB: [birth year] Today's Date: 12/19/2019    History of Present Illness Carrie Black is a 64 y.o. female with medical history significant of Covid test + December 13, 2019, endometriosis. Presented with  Cough, fever up to 100.4 have been reporting CP and SOB since yesterday. to care and was noted to be satting 76% on RA was placed on 5 L if her oxygenation improved to 90%.  She reports fevers at home up to 101.5 she has tried to take ibuprofen to control her fever.  She has been overall sick for the past 2 weeks but tested only positive yesterday. She has been treated by provider with prednisone  And azithromycin for the past few days but without improvement. The been seen in urgent care she was transported by EMS to [facility], ER   Clinical Impression   PTA pt lived with her husband, independent in ADL, IADL, and mobility tasks. Pt does not ambulate with an assistive device and reports 0 falls in the last 6 months. Pt does not use oxygen at home and is currently on 15L HFNC with SpO2 85% at rest. Pt currently independent to min guard for self-care and functional transfer tasks. Educated pt on breathing strategies and exercises with good understanding. Pt completed toileting task with BSC and SpO2 dropping to 74%. Pt required 1.5 min seated rest break to recover to 88%. Pt tolerated standing 1 x 4 min at the sink to complete grooming/hygiene tasks with SpO2 dropping to 75%. Pt required ~4-5 min seated recovery to return to 85% on 15L HFNC. Pt reports min to mod shortness of breath during activity. Pt demonstrates decreased strength, endurance, balance, standing tolerance, and activity tolerance impacting ability to complete self-care and functional transfer tasks. Recommend skilled OT services to address above deficits in order to promote function and prevent further decline.      Follow Up Recommendations  No OT follow  up;Supervision - Intermittent    Equipment Recommendations  None recommended by OT    Recommendations for Other Services       Precautions / Restrictions Precautions Precautions: Other (comment) Precaution Comments: Monitor SpO2, desats quickly Restrictions Weight Bearing Restrictions: No      Mobility Bed Mobility               General bed mobility comments: Pt seated in bedside chair upon OT arrival.  Transfers Overall transfer level: Needs assistance Equipment used: None Transfers: Sit to/from Stand;Stand Pivot Transfers Sit to Stand: Min guard;Supervision Stand pivot transfers: Supervision;Min guard       General transfer comment: assist for line management and safety    Balance Overall balance assessment: No apparent balance deficits (not formally assessed)                                         ADL either performed or assessed with clinical judgement   ADL Overall ADL's : Needs assistance/impaired Eating/Feeding: Independent;Sitting   Grooming: Set up;Supervision/safety;Standing   Upper Body Bathing: Set up;Supervision/ safety;Sitting   Lower Body Bathing: Set up;Supervison/ safety;Sit to/from stand   Upper Body Dressing : Set up;Supervision/safety;Sitting   Lower Body Dressing: Set up;Supervision/safety;Sit to/from stand   Toilet Transfer: Supervision/safety;Min guard;BSC   Toileting- Architect and Hygiene: Supervision/safety;Min guard;Sit to/from stand       Functional mobility during ADLs: Min guard General ADL  Comments: Pt able to stand pivot to/from [facility] and stand at the sink 1 x 4 min.     Vision Baseline Vision/History: No visual deficits       Perception     Praxis      Pertinent Vitals/Pain Pain Assessment: No/denies pain     Hand Dominance Right   Extremity/Trunk Assessment Upper Extremity Assessment Upper Extremity Assessment: Generalized weakness   Lower Extremity Assessment Lower  Extremity Assessment: Defer to PT evaluation       Communication Communication Communication: No difficulties   Cognition Arousal/Alertness: Awake/alert Behavior During Therapy: WFL for tasks assessed/performed Overall Cognitive Status: Within Functional Limits for tasks assessed                                     General Comments  Pt on 15L HFNC with SpO2 85% at rest with no reports of SOB. SpO2 decreased to 70s during activity with pt requiring ~4-5 min seated rest break to recover to 85-88%.     Exercises Exercises: Other exercises Other Exercises Other Exercises: Incentive spirometer x 10 with min cues on technique. Pulling Other Exercises: Flutter valve x 10 Other Exercises: Encouraged pursed lip breathing throughout   Shoulder Instructions      Home Living Family/patient expects to be discharged to:: Private residence Living Arrangements: Spouse/significant other Available Help at Discharge: Family Type of Home: Mobile home Home Access: Stairs to enter Secretary/administrator of Steps: 3   Home Layout: One level     Bathroom Shower/Tub: Chief Strategy Officer: Standard     Home Equipment: Grab bars - tub/shower          Prior Functioning/Environment Level of Independence: Independent        Comments: Pt independent in ADLs, IADLs, and mobility. Pt does not ambulate with an assistive device and reports 0 falls in the last 6 months. Pt does not use oxygen at home. Pt still drives. Pt was working up until April 2020.         OT Problem List: Decreased strength;Decreased activity tolerance;Impaired balance (sitting and/or standing);Decreased safety awareness;Cardiopulmonary status limiting activity      OT Treatment/Interventions: Self-care/ADL training;Therapeutic exercise;Neuromuscular education;Energy conservation;DME and/or AE instruction;Therapeutic activities;Patient/family education;Balance training    OT  Goals(Current goals can be found in the care plan section) Acute Rehab OT Goals Patient Stated Goal: to go home Time For Goal Achievement: 01/02/20 Potential to Achieve Goals: Good ADL Goals Pt Will Perform Grooming: Independently;standing Pt Will Perform Lower Body Bathing: Independently;sit to/from stand Pt Will Perform Lower Body Dressing: Independently;sit to/from stand Pt Will Transfer to Toilet: Independently;ambulating;regular height toilet Pt Will Perform Toileting - Clothing Manipulation and hygiene: Independently;sit to/from stand Additional ADL Goal #1: Pt to recall and verbalize 3 energy conservation strategies with 0 verbal cues. Additional ADL Goal #2: Pt to tolerate standing up to 10 min independently with SpO2 maintaining above 88%, in preparation for ADLs.  OT Frequency: Min 3X/week   Barriers to D/C:            Co-evaluation              AM-PAC OT "6 Clicks" Daily Activity     Outcome Measure Help from another person eating meals?: None Help from another person taking care of personal grooming?: A Little Help from another person toileting, which includes using toliet, bedpan, or urinal?: A Little Help from another  person bathing (including washing, rinsing, drying)?: A Little Help from another person to put on and taking off regular upper body clothing?: A Little Help from another person to put on and taking off regular lower body clothing?: A Little 6 Click Score: 19   End of Session Equipment Utilized During Treatment: Oxygen Nurse Communication: Mobility status  Activity Tolerance: Other (comment)(Limited by SOB) Patient left: in chair;with call bell/phone within reach  OT Visit Diagnosis: Unsteadiness on feet (R26.81);Muscle weakness (generalized) (M62.81)                Time: 9169-4503 OT Time Calculation (min): 46 min Charges:  OT General Charges $OT Visit: 1 Visit OT Evaluation $OT Eval Moderate Complexity: 1 Mod OT Treatments $Self  Care/Home Management : 8-22 mins $Therapeutic Activity: 8-22 mins  Mauri Brooklyn OTR/L [phone]   Mauri Brooklyn 12/19/2019, 11:18 AM

## 2019-12-19 NOTE — Progress Notes (Signed)
Patient up to [facility] commode x 3 this shift so far.  Patient on 15 L HFNC with sats in the high 80's.  When patient goes to move she applies Non-rebreather along with the HFNC and patient still desats to lower 80's for the 2-3 minutes while she is at [facility] and work of breathing is increased with accessory muscle use, but patient able to calm down and return to WNL oxygen sats shortly after laying back in bed.  Patient continues to work on flutter valve.  Patient only able to achieve 300-572mls on ISO.

## 2019-12-19 NOTE — Evaluation (Signed)
Physical Therapy Evaluation Patient Details Name: Carrie Black MRN: [id number] DOB: May 05, 1956 Today's Date: 12/19/2019   History of Present Illness  Carrie Black is a 64 y.o. female with medical history significant of Covid test + December 13, 2019, endometriosis. Presented with  Cough, fever up to 100.4 have been reporting CP and SOB since yesterday. to care and was noted to be satting 76% on RA was placed on 5 L if her oxygenation improved to 90%.  She reports fevers at home up to 101.5 she has tried to take ibuprofen to control her fever.  She has been overall sick for the past 2 weeks but tested only positive yesterday. She has been treated by provider with prednisone  And azithromycin for the past few days but without improvement. The been seen in urgent care she was transported by EMS to [facility] ER  Clinical Impression   Pt admitted with above diagnosis. PTA was living home with family and was very independent. Pt currently with functional limitations due to the deficits listed below (see PT Problem List). Currently pt limited by apprehension with mobility, decreased activity tolerance and hence independence and safety with mobility. She needed min guard assist with mobility this pm, was able to ambulate in room approx 68ft wih hand held assist, pt was on 15L/min via HFNC and was noted to desat to 79%. Pt has been using NRB with meals etc, but during this session was able to follow cues for pursed lip breathing and recover without NRB. Needs specific cues for pursed lip breathing, Max 02 saturation noted was 86% during this session after activity, at rest was able to sat in low 90s. Pt will benefit from skilled PT to increase their independence and safety with mobility to allow discharge to the venue listed below.       Follow Up Recommendations No PT follow up;Supervision - Intermittent;Supervision for mobility/OOB    Equipment Recommendations  None recommended by PT    Recommendations for  Other Services       Precautions / Restrictions Precautions Precautions: Other (comment) Precaution Comments: Monitor SpO2, desats quickly, highly apprehensive about mobility also Restrictions Weight Bearing Restrictions: No      Mobility  Bed Mobility               General bed mobility comments: pt received sitting in recliner  Transfers Overall transfer level: Needs assistance Equipment used: None Transfers: Sit to/from Stand;Stand Pivot Transfers Sit to Stand: Min guard Stand pivot transfers: Min guard       General transfer comment: set up and line management  Ambulation/Gait Ambulation/Gait assistance: Min guard Gait Distance (Feet): 30 Feet Assistive device: 1 person hand held assist Gait Pattern/deviations: Step-through pattern;Narrow base of support;Staggering left;Staggering right     General Gait Details: ambulated in room approx 27ft with hand held assist, pt was on 15L HFNC and desat to min 79% needed continued cues and encouragement for pursed lip breathing once complete with ambulation needed <70min to recover to 86% did not use NRB this session  Stairs            Wheelchair Mobility    Modified Rankin (Stroke Patients Only)       Balance Overall balance assessment: Mild deficits observed, not formally tested  Pertinent Vitals/Pain Pain Assessment: Faces Faces Pain Scale: Hurts a little bit Pain Location: w/ increased work of breathing Pain Descriptors / Indicators: Grimacing Pain Intervention(s): Limited activity within patient's tolerance    Home Living Family/patient expects to be discharged to:: Private residence Living Arrangements: Spouse/significant other Available Help at Discharge: Family Type of Home: Mobile home Home Access: Stairs to enter   Secretary/administrator of Steps: 3 Home Layout: One level Home Equipment: Grab bars - tub/shower      Prior  Function Level of Independence: Independent               Hand Dominance   Dominant Hand: Right    Extremity/Trunk Assessment   Upper Extremity Assessment Upper Extremity Assessment: Generalized weakness    Lower Extremity Assessment Lower Extremity Assessment: Generalized weakness    Cervical / Trunk Assessment Cervical / Trunk Assessment: Normal  Communication   Communication: No difficulties;Prefers language other than English(spanish is more proficient language)  Cognition Arousal/Alertness: Awake/alert Behavior During Therapy: WFL for tasks assessed/performed Overall Cognitive Status: No family/caregiver present to determine baseline cognitive functioning                                 General Comments: seems to be close to or at baseline just apprehensive      General Comments      Exercises Other Exercises Other Exercises: incentive spirometer x 5 pulls Other Exercises: flutter valve x 5   Assessment/Plan    PT Assessment Patient needs continued PT services  PT Problem List Decreased strength;Decreased activity tolerance;Decreased balance;Decreased mobility;Decreased coordination;Decreased safety awareness       PT Treatment Interventions Gait training;Stair training;Functional mobility training;Therapeutic activities;Therapeutic exercise;Balance training;Neuromuscular re-education;Patient/family education    PT Goals (Current goals can be found in the Care Plan section)  Acute Rehab PT Goals Patient Stated Goal: to go home PT Goal Formulation: With patient Time For Goal Achievement: 01/02/20 Potential to Achieve Goals: Good    Frequency Min 3X/week   Barriers to discharge        Co-evaluation               AM-PAC PT "6 Clicks" Mobility  Outcome Measure Help needed turning from your back to your side while in a flat bed without using bedrails?: None Help needed moving from lying on your back to sitting on the side  of a flat bed without using bedrails?: None Help needed moving to and from a bed to a chair (including a wheelchair)?: A Little Help needed standing up from a chair using your arms (e.g., wheelchair or bedside chair)?: A Little Help needed to walk in hospital room?: A Little Help needed climbing 3-5 steps with a railing? : A Lot 6 Click Score: 19    End of Session Equipment Utilized During Treatment: Oxygen Activity Tolerance: Patient limited by fatigue;Patient limited by lethargy;Treatment limited secondary to medical complications (Comment) Patient left: in chair;with call bell/phone within reach;with chair alarm set Nurse Communication: Mobility status PT Visit Diagnosis: Other abnormalities of gait and mobility (R26.89)    Time: 1438-1500 PT Time Calculation (min) (ACUTE ONLY): 22 min   Charges:   PT Evaluation $PT Eval Moderate Complexity: 1 Mod PT Treatments $Self Care/Home Management: 8-22        Drema Pry, PT   Freddi Starr 12/19/2019, 3:25 PM

## 2019-12-19 NOTE — Progress Notes (Signed)
Carrie Black  ZOX:[id number] DOB: November 23, 1955 DOA: 12/14/2019 PCP: System, Pcp Not In    Brief Narrative:  64 year old with a history of endometriosis who presented with a cough and fever of 2 weeks duration and was found to have oxygen saturations of 76% on room air. She tested + for COVID 1/22.   Significant Events: 1/8 approximate symptom onset  1/22 COVID+  1/23 admit via [place] -transferred to [facility] - confirmed COVID+  COVID-19 specific Treatment: Decadron 1/23 > Remdesivir 1/23 > 1/27 Actemra 1/23 Convalescent plasma on 1/24  Antimicrobials:  none  Subjective: Continues to require high level oxygen support at 15 L of high flow nasal cannula, and is dependent upon the addition of NRB support with the slightest ambulation during which she still desaturates into the low 80s with clear respiratory distress.  She appears comfortable and pleasant when resting in a chair but does have to pause between sentences to catch her breath.  She remains quite tenuous overall.  She denies chest pain nausea vomiting or abdominal pain.  She reports a fair appetite.  Assessment & Plan:  COVID Pneumonia -acute hypoxic respiratory failure Continue Decadron - has completed a course of remdesivir -was dosed with Actemra and convalescent plasma - attempt to gently diurese - counseled on need for proning, and religious use of IC - she is very motivated and cooperative - may require HHFNC on 1C if does not improve over course of the afternoon   Recent Labs  Lab 12/14/19 1407 12/15/19 0850 12/16/19 0200 12/17/19 0238 12/18/19 0240 12/18/19 0255  DDIMER 1.08* 1.16* 1.14* 1.14*  --  1.18*  FERRITIN 508* 527* 503* 456* 361*  --   CRP 19.9* 25.2* 15.4* 6.9* 3.1*  --   ALT 25 26 23 22   --  25  PROCALCITON <0.10  --   --   --   --   --     Newly diagnosed DM 2 A1c 7.5 -CBG trending upward - adjust tx and follow   DVT prophylaxis: Lovenox Code Status: FULL CODE Family Communication:    Disposition Plan: PCU - high risk for further decline   Consultants:  none  Objective: Blood pressure 107/68, pulse 91, temperature 98.1 F (36.7 C), temperature source Axillary, resp. rate (!) 23, height 5\' 1"  (1.549 m), weight 63.5 kg, SpO2 (!) 83 %.  Intake/Output Summary (Last 24 hours) at 12/19/2019 0842 Last data filed at 12/19/2019 0400 Gross per 24 hour  Intake 840 ml  Output 900 ml  Net -60 ml   Filed Weights   12/14/19 1337  Weight: 63.5 kg    Examination: General: Mild distress but able to complete full sentences with pausing between - appears to be tolerating well  Lungs: Fine crackles diffusely - no wheezing  Cardiovascular: RRR - no Rub or M Abdomen: NT/ND, soft, bs+, no mass  Extremities: trace B LE edema   CBC: Recent Labs  Lab 12/16/19 0200 12/17/19 0238 12/18/19 0255  WBC 3.0* 6.7 8.1  NEUTROABS 2.5 5.5 7.1  HGB 10.7* 12.1 12.4  HCT 32.8* 37.5 37.5  MCV 84.5 84.5 83.3  PLT 300 420* 811*   Basic Metabolic Panel: Recent Labs  Lab 12/16/19 0200 12/17/19 0238 12/18/19 0255  NA 139 141 140  K 4.1 4.0 3.9  CL 103 103 101  CO2 26 27 27   GLUCOSE 307* 215* 197*  BUN 18 18 21   CREATININE 0.51 0.50 0.57  CALCIUM 9.1 9.4 9.2  MG 2.1 2.0 2.2  PHOS 2.4* 3.9 3.6   GFR: Estimated Creatinine Clearance: 61.5 mL/min (by C-G formula based on SCr of 0.57 mg/dL).  Liver Function Tests: Recent Labs  Lab 12/15/19 0850 12/16/19 0200 12/17/19 0238 12/18/19 0255  AST 32 26 26 27   ALT 26 23 22 25   ALKPHOS 75 68 66 66  BILITOT 0.4 0.5 0.2* 0.9  PROT 7.3 7.1 7.1 7.0  ALBUMIN 3.2* 3.2* 3.3* 3.3*    HbA1C: Hgb A1c MFr Bld  Date/Time Value Ref Range Status  12/16/2019 02:00 AM 7.5 (H) 4.8 - 5.6 % Final    Comment:    (NOTE) Pre diabetes:          5.7%-6.4% Diabetes:              >6.4% Glycemic control for   <7.0% adults with diabetes   12/15/2019 08:50 AM 7.6 (H) 4.8 - 5.6 % Final    Comment:    (NOTE) Pre diabetes:           5.7%-6.4% Diabetes:              >6.4% Glycemic control for   <7.0% adults with diabetes     CBG: Recent Labs  Lab 12/18/19 1155 12/18/19 1619 12/18/19 1941 12/18/19 2346 12/19/19 0739  GLUCAP 285* 190* 336* 361* 213*    Recent Results (from the past 240 hour(s))  Blood Culture (routine x 2)     Status: None (Preliminary result)   Collection Time: 12/14/19  4:37 PM   Specimen: BLOOD RIGHT HAND  Result Value Ref Range Status   Specimen Description BLOOD RIGHT HAND  Final   Special Requests   Final    BOTTLES DRAWN AEROBIC AND ANAEROBIC Blood Culture adequate volume   Culture   Final    NO GROWTH 4 DAYS Performed at [facility], [address], [place], [address] [place]    Report Status PENDING  Incomplete  Blood Culture (routine x 2)     Status: None (Preliminary result)   Collection Time: 12/14/19  5:08 PM   Specimen: BLOOD  Result Value Ref Range Status   Specimen Description BLOOD RIGHT ANTECUBITAL  Final   Special Requests   Final    BOTTLES DRAWN AEROBIC AND ANAEROBIC Blood Culture adequate volume   Culture   Final    NO GROWTH 4 DAYS Performed at [facility], [address] [place], [address] [place]    Report Status PENDING  Incomplete     Scheduled Meds: . vitamin C  500 mg Oral Daily  . dexamethasone (DECADRON) injection  6 mg Intravenous Q24H  . enoxaparin (LOVENOX) injection  40 mg Subcutaneous Q24H  . furosemide  40 mg Intravenous Once  . insulin aspart  0-5 Units Subcutaneous QHS  . insulin aspart  0-9 Units Subcutaneous TID WC  . insulin detemir  10 Units Subcutaneous BID  . Ipratropium-Albuterol  1 puff Inhalation QID  . linagliptin  5 mg Oral Daily  . sodium chloride flush  3 mL Intravenous Q12H  . zinc sulfate  220 mg Oral Daily     LOS: 5 days   [place], MD [facility] Office  [phone] Pager - Text Page per Amion  If 7PM-7AM, please contact night-coverage per Amion 12/19/2019, 8:42 AM

## 2019-12-19 NOTE — Progress Notes (Signed)
Updated pt daughter via phone.

## 2019-12-20 ENCOUNTER — Inpatient Hospital Stay (HOSPITAL_COMMUNITY): Payer: HRSA Program

## 2019-12-20 LAB — COMPREHENSIVE METABOLIC PANEL
ALT: 18 U/L (ref 0–44)
AST: 20 U/L (ref 15–41)
Albumin: 3.4 g/dL — ABNORMAL LOW (ref 3.5–5.0)
Alkaline Phosphatase: 72 U/L (ref 38–126)
Anion gap: 10 (ref 5–15)
BUN: 23 mg/dL (ref 8–23)
CO2: 27 mmol/L (ref 22–32)
Calcium: 8.8 mg/dL — ABNORMAL LOW (ref 8.9–10.3)
Chloride: 95 mmol/L — ABNORMAL LOW (ref 98–111)
Creatinine, Ser: 0.7 mg/dL (ref 0.44–1.00)
GFR calc Af Amer: >60 mL/min (ref >60)
GFR calc non Af Amer: >60 mL/min (ref >60)
Glucose, Bld: 301 mg/dL — ABNORMAL HIGH (ref 70–99)
Potassium: 4.5 mmol/L (ref 3.5–5.1)
Sodium: 132 mmol/L — ABNORMAL LOW (ref 135–145)
Total Bilirubin: 0.8 mg/dL (ref 0.3–1.2)
Total Protein: 6.6 g/dL (ref 6.5–8.1)

## 2019-12-20 LAB — GLUCOSE, CAPILLARY
Glucose-Capillary: 188 mg/dL — ABNORMAL HIGH (ref 70–99)
Glucose-Capillary: 295 mg/dL — ABNORMAL HIGH (ref 70–99)
Glucose-Capillary: 233 mg/dL — ABNORMAL HIGH (ref 70–99)
Glucose-Capillary: 203 mg/dL — ABNORMAL HIGH (ref 70–99)
Glucose-Capillary: 305 mg/dL — ABNORMAL HIGH (ref 70–99)
Glucose-Capillary: 297 mg/dL — ABNORMAL HIGH (ref 70–99)

## 2019-12-20 LAB — CBC
HCT: 38.6 % (ref 36.0–46.0)
Hemoglobin: 12.9 g/dL (ref 12.0–15.0)
MCH: 27.4 pg (ref 26.0–34.0)
MCHC: 33.4 g/dL (ref 30.0–36.0)
MCV: 82.1 fL (ref 80.0–100.0)
Platelets: 486 10*3/uL — ABNORMAL HIGH (ref 150–400)
RBC: 4.7 MIL/uL (ref 3.87–5.11)
RDW: 13 % (ref 11.5–15.5)
WBC: 10.4 10*3/uL (ref 4.0–10.5)
nRBC: 0 % (ref 0.0–0.2)

## 2019-12-20 LAB — MAGNESIUM: Magnesium: 2.3 mg/dL (ref 1.7–2.4)

## 2019-12-20 MED ORDER — ENSURE MAX PROTEIN PO LIQD
11.0000 [oz_av] | Freq: Two times a day (BID) | ORAL | Status: DC
  Administered 2019-12-20 – 2019-12-26 (×10): 11 [oz_av] via ORAL
  Filled 2019-12-20 (×16): qty 330

## 2019-12-20 MED ORDER — INSULIN DETEMIR 100 UNIT/ML ~~LOC~~ SOLN
18.0000 [IU] | Freq: Two times a day (BID) | SUBCUTANEOUS | Status: DC
  Administered 2019-12-20 – 2019-12-21 (×2): 18 [IU] via SUBCUTANEOUS
  Filled 2019-12-20 (×3): qty 0.18

## 2019-12-20 NOTE — Plan of Care (Signed)
  Problem: Education: Goal: Knowledge of risk factors and measures for prevention of condition will improve Outcome: Progressing   Problem: Coping: Goal: Psychosocial and spiritual needs will be supported Outcome: Progressing   Problem: Respiratory: Goal: Will maintain a patent airway Outcome: Progressing Goal: Complications related to the disease process, condition or treatment will be avoided or minimized Outcome: Progressing   Problem: Education: Goal: Ability to describe self-care measures that may prevent or decrease complications (Diabetes Survival Skills Education) will improve Outcome: Progressing   Problem: Coping: Goal: Ability to adjust to condition or change in health will improve Outcome: Progressing   Problem: Fluid Volume: Goal: Ability to maintain a balanced intake and output will improve Outcome: Progressing   Problem: Health Behavior/Discharge Planning: Goal: Ability to identify and utilize available resources and services will improve Outcome: Progressing Goal: Ability to manage health-related needs will improve Outcome: Progressing   Problem: Nutritional: Goal: Progress toward achieving an optimal weight will improve Outcome: Progressing   Problem: Metabolic: Goal: Ability to maintain appropriate glucose levels will improve Outcome: Not Progressing   Problem: Nutritional: Goal: Maintenance of adequate nutrition will improve Outcome: Not Progressing

## 2019-12-20 NOTE — Progress Notes (Signed)
Spoke with patients daughter, Jaide Hillenburg, and gave an update on patients status and POC. All questions answered at this time.

## 2019-12-20 NOTE — Progress Notes (Signed)
Physical Therapy Treatment Patient Details Name: Carrie Black MRN: [id number] DOB: Mar 10, 1956 Today's Date: 12/20/2019    History of Present Illness Carrie Black is a 64 y.o. female with medical history significant of Covid test + December 13, 2019, endometriosis. Presented with  Cough, fever up to 100.4 have been reporting CP and SOB since yesterday. to care and was noted to be satting 76% on RA was placed on 5 L if her oxygenation improved to 90%.  She reports fevers at home up to 101.5 she has tried to take ibuprofen to control her fever.  She has been overall sick for the past 2 weeks but tested only positive yesterday. She has been treated by provider with prednisone  And azithromycin for the past few days but without improvement. The been seen in urgent care she was transported by EMS to [facility], ER    PT Comments    Steady progress with mobility this session. Pt has been having difficulty with maintaining sats in 90s, at therapist arrival pt has on 15L via HFNC and 15 via NRB and sats 96% laying in bed. With supine to sit desat to 87% once up to low 90s again attempt having pt on NRB only and desat to 87%, attempted HFNC only with same results, had pt on 25L via NRB and was able to maintain in low 90s at rest. Ambulated approx 123ft with stand by assist and was able to maintain sats >88, once completed and sitting edge of bed again desat to 85% once returned to 15NRB and 15HFNC was able to recover to low 90s again within . Pt transferred bed to [facility] with SBA and line management help.     Follow Up Recommendations  No PT follow up;Supervision - Intermittent;Supervision for mobility/OOB     Equipment Recommendations  None recommended by PT    Recommendations for Other Services       Precautions / Restrictions Precautions Precautions: Other (comment) Precaution Comments: Monitor SpO2, desats quickly, highly apprehensive about mobility also Restrictions Weight Bearing Restrictions:  No    Mobility  Bed Mobility Overal bed mobility: Modified Independent                Transfers Overall transfer level: Needs assistance Equipment used: None Transfers: Sit to/from Stand Sit to Stand: Min guard;Supervision            Ambulation/Gait Ambulation/Gait assistance: Supervision Gait Distance (Feet): 110 Feet Assistive device: None Gait Pattern/deviations: Step-through pattern     General Gait Details: ambulated on 25L via NRB min desat with ambulation was 85%   Stairs             Wheelchair Mobility    Modified Rankin (Stroke Patients Only)       Balance Overall balance assessment: Mild deficits observed, not formally tested                                          Cognition Arousal/Alertness: Awake/alert Behavior During Therapy: WFL for tasks assessed/performed Overall Cognitive Status: No family/caregiver present to determine baseline cognitive functioning                                        Exercises Other Exercises Other Exercises: pursed lip breathing     General Comments  Pertinent Vitals/Pain Pain Assessment: Faces Faces Pain Scale: Hurts a little bit Pain Location: w/ increased work of breathing    Home Living                      Prior Function            PT Goals (current goals can now be found in the care plan section) Acute Rehab PT Goals Patient Stated Goal: to go home PT Goal Formulation: With patient Time For Goal Achievement: 01/02/20 Potential to Achieve Goals: Good Progress towards PT goals: Progressing toward goals    Frequency    Min 3X/week      PT Plan Current plan remains appropriate    Co-evaluation              AM-PAC PT "6 Clicks" Mobility   Outcome Measure  Help needed turning from your back to your side while in a flat bed without using bedrails?: None Help needed moving from lying on your back to sitting on the side of  a flat bed without using bedrails?: None Help needed moving to and from a bed to a chair (including a wheelchair)?: A Little Help needed standing up from a chair using your arms (e.g., wheelchair or bedside chair)?: A Little Help needed to walk in hospital room?: A Little Help needed climbing 3-5 steps with a railing? : A Lot 6 Click Score: 19    End of Session Equipment Utilized During Treatment: Oxygen Activity Tolerance: Patient limited by fatigue;Patient limited by lethargy;Treatment limited secondary to medical complications (Comment) Patient left: in bed;with call bell/phone within reach Nurse Communication: Mobility status PT Visit Diagnosis: Other abnormalities of gait and mobility (R26.89)     Time: 1537-1600 PT Time Calculation (min) (ACUTE ONLY): 23 min  Charges:  $Gait Training: 8-22 mins $Self Care/Home Management: 8-22                     Horald Chestnut, PT    Delford Field 12/20/2019, 4:16 PM

## 2019-12-20 NOTE — Progress Notes (Addendum)
Initial Nutrition Assessment  DOCUMENTATION CODES:   Not applicable  INTERVENTION:    Ensure Max po BID, each supplement provides 150 kcal and 30 grams of protein.   MVI with minerals daily.  Pt receiving Hormel Shake daily with Breakfast which provides 520 kcals and 22 g of protein and Magic cup BID with lunch and dinner, each supplement provides 290 kcal and 9 grams of protein, automatically on meal trays to optimize nutritional intake.   NUTRITION DIAGNOSIS:   Increased nutrient needs related to acute illness, catabolic illness(COVID-19) as evidenced by estimated needs.  GOAL:   Patient will meet greater than or equal to 90% of their needs  MONITOR:   PO intake, Supplement acceptance  REASON FOR ASSESSMENT:   Consult Diet education  ASSESSMENT:   64 yo female admitted with cough, fever, tested positive for COVID on 1/22. PMH includes endometriosis.   Currently on a CHO modified diet, consuming 50% of meals on average. Appetite is fair.   Diet education for diabetes is not appropriate at this time in the acute care setting. Recommend outpatient DM diet education after D/C home.   Labs reviewed. Sodium 132 (L) CBG's: [phone]  Medications reviewed and include vitamin C, decadron, lasix, novolog SSI QID, Levemir, Tradjenta, zinc sulfate.   No recent weight encounters available for review.   Intake is suboptimal. Patient would benefit from PO supplements to ensure adequate intake to meet increased nutrition needs for COVID-19.  NUTRITION - FOCUSED PHYSICAL EXAM:  unable to complete d/t COVID restrictions  Diet Order:   Diet Order            Diet Carb Modified Fluid consistency: Thin; Room service appropriate? Yes  Diet effective now              EDUCATION NEEDS:   Not appropriate for education at this time  Skin:  Skin Assessment: Reviewed RN Assessment  Last BM:  1/28  Height:   Ht Readings from Last 1 Encounters:  12/14/19 5\' 1"  (1.549 m)     Weight:   Wt Readings from Last 1 Encounters:  12/14/19 63.5 kg    Ideal Body Weight:  47.7 kg  BMI:  Body mass index is 26.45 kg/m.  Estimated Nutritional Needs:   Kcal:  1800-2000  Protein:  85-100 gm  Fluid:  >/= 1.7 L    12/16/19, RD, LDN, CNSC Pager [phone] After Hours Pager [phone]

## 2019-12-20 NOTE — Progress Notes (Signed)
Carrie Black  QQI:[id number] DOB: May 06, 1956 DOA: 12/14/2019 PCP: System, Pcp Not In    Brief Narrative:  64 year old with a history of endometriosis who presented with a cough and fever of 2 weeks duration and was found to have oxygen saturations of 76% on room air. She tested + for COVID 1/22.   Significant Events: 1/8 approximate symptom onset  1/22 COVID+  1/23 admit via [place] -transferred to [facility] - confirmed COVID+  COVID-19 specific Treatment: Decadron 1/23 > Remdesivir 1/23 > 1/27 Actemra 1/23 Convalescent plasma on 1/24  Antimicrobials:  none  Subjective: Desaturating markedly with physical exertion.  Continues to require 15 L salter high flow nasal cannula.  Despite high oxygen needs.  Comfortable at time of exam and states that she feels slightly better.  She remains at high risk for acute severe decompensation but for now continues to hold steady with her 15 L of support.  Assessment & Plan:  COVID Pneumonia -acute hypoxic respiratory failure Continue Decadron - has completed a course of remdesivir -was dosed with Actemra and convalescent plasma - attempt to gently diurese - counseled on need for proning, and religious use of IC - she is very motivated and cooperative - may require HHFNC on 1C if does not improve further over the next 24hrs    Recent Labs  Lab 12/14/19 1407 12/14/19 1407 12/15/19 0850 12/16/19 0200 12/17/19 0238 12/18/19 0240 12/18/19 0255 12/20/19 0245  DDIMER 1.08*  --  1.16* 1.14* 1.14*  --  1.18*  --   FERRITIN 508*  --  527* 503* 456* 361*  --   --   CRP 19.9*  --  25.2* 15.4* 6.9* 3.1*  --   --   ALT 25   < > 26 23 22   --  25 18  PROCALCITON <0.10  --   --   --   --   --   --   --    < > = values in this interval not displayed.    Newly diagnosed DM 2 A1c 7.5 - CBG not yet at goal -adjust treatment and follow trend  DVT prophylaxis: Lovenox Code Status: FULL CODE Family Communication:  Disposition Plan: PCU - high risk for  further decline   Consultants:  none  Objective: Blood pressure 109/71, pulse 68, temperature 98.1 F (36.7 C), temperature source Oral, resp. rate 20, height 5\' 1"  (1.549 m), weight 63.5 kg, SpO2 94 %.  Intake/Output Summary (Last 24 hours) at 12/20/2019 0951 Last data filed at 12/19/2019 2200 Gross per 24 hour  Intake 120 ml  Output --  Net 120 ml   Filed Weights   12/14/19 1337  Weight: 63.5 kg    Examination: General: Holding steady in mild distress but able to communicate and reports that she feels comfortable Lungs: Fine crackles diffusely without change with no active wheeze Cardiovascular: RRR - no Rub or M Abdomen: NT/ND, soft, bs+, no mass  Extremities: trace B lower extremity edema  CBC: Recent Labs  Lab 12/16/19 0200 12/16/19 0200 12/17/19 0238 12/18/19 0255 12/20/19 0245  WBC 3.0*   < > 6.7 8.1 10.4  NEUTROABS 2.5  --  5.5 7.1  --   HGB 10.7*   < > 12.1 12.4 12.9  HCT 32.8*   < > 37.5 37.5 38.6  MCV 84.5   < > 84.5 83.3 82.1  PLT 300   < > 420* 452* 486*   < > = values in this interval not displayed.  Basic Metabolic Panel: Recent Labs  Lab 12/16/19 0200 12/16/19 0200 12/17/19 0238 12/18/19 0255 12/20/19 0245  NA 139   < > 141 140 132*  K 4.1   < > 4.0 3.9 4.5  CL 103   < > 103 101 95*  CO2 26   < > 27 27 27   GLUCOSE 307*   < > 215* 197* 301*  BUN 18   < > 18 21 23   CREATININE 0.51   < > 0.50 0.57 0.70  CALCIUM 9.1   < > 9.4 9.2 8.8*  MG 2.1   < > 2.0 2.2 2.3  PHOS 2.4*  --  3.9 3.6  --    < > = values in this interval not displayed.   GFR: Estimated Creatinine Clearance: 61.5 mL/min (by C-G formula based on SCr of 0.7 mg/dL).  Liver Function Tests: Recent Labs  Lab 12/16/19 0200 12/17/19 0238 12/18/19 0255 12/20/19 0245  AST 26 26 27 20   ALT 23 22 25 18   ALKPHOS 68 66 66 72  BILITOT 0.5 0.2* 0.9 0.8  PROT 7.1 7.1 7.0 6.6  ALBUMIN 3.2* 3.3* 3.3* 3.4*    HbA1C: Hgb A1c MFr Bld  Date/Time Value Ref Range Status  12/16/2019  02:00 AM 7.5 (H) 4.8 - 5.6 % Final    Comment:    (NOTE) Pre diabetes:          5.7%-6.4% Diabetes:              >6.4% Glycemic control for   <7.0% adults with diabetes   12/15/2019 08:50 AM 7.6 (H) 4.8 - 5.6 % Final    Comment:    (NOTE) Pre diabetes:          5.7%-6.4% Diabetes:              >6.4% Glycemic control for   <7.0% adults with diabetes     CBG: Recent Labs  Lab 12/19/19 1150 12/19/19 1547 12/19/19 2155 12/20/19 0004 12/20/19 0745  GLUCAP 300* 231* 320* 295* 188*    Recent Results (from the past 240 hour(s))  Blood Culture (routine x 2)     Status: None   Collection Time: 12/14/19  4:37 PM   Specimen: BLOOD RIGHT HAND  Result Value Ref Range Status   Specimen Description BLOOD RIGHT HAND  Final   Special Requests   Final    BOTTLES DRAWN AEROBIC AND ANAEROBIC Blood Culture adequate volume   Culture   Final    NO GROWTH 5 DAYS Performed at [facility], [address] [place], 12/16/19 [facility]    Report Status 12/19/2019 FINAL  Final  Blood Culture (routine x 2)     Status: None   Collection Time: 12/14/19  5:08 PM   Specimen: BLOOD  Result Value Ref Range Status   Specimen Description BLOOD RIGHT ANTECUBITAL  Final   Special Requests   Final    BOTTLES DRAWN AEROBIC AND ANAEROBIC Blood Culture adequate volume   Culture   Final    NO GROWTH 5 DAYS Performed at [facility], [address], [place], 12/16/19 [facility]    Report Status 12/19/2019 FINAL  Final     Scheduled Meds: . vitamin C  500 mg Oral Daily  . dexamethasone (DECADRON) injection  6 mg Intravenous Q24H  . enoxaparin (LOVENOX) injection  40 mg Subcutaneous Q24H  . furosemide  40 mg Intravenous Daily  . insulin aspart  0-5 Units Subcutaneous QHS  .  insulin aspart  0-9 Units Subcutaneous TID WC  . insulin detemir  14 Units Subcutaneous BID  . Ipratropium-Albuterol  1 puff Inhalation QID  . linagliptin  5 mg Oral Daily  . pantoprazole  40 mg Oral Daily  . sodium  chloride flush  3 mL Intravenous Q12H  . zinc sulfate  220 mg Oral Daily     LOS: 6 days   Lonia Blood, MD [facility] Office  [phone] Pager - Text Page per Amion  If 7PM-7AM, please contact night-coverage per Amion 12/20/2019, 9:51 AM

## 2019-12-21 LAB — GLUCOSE, CAPILLARY
Glucose-Capillary: 189 mg/dL — ABNORMAL HIGH (ref 70–99)
Glucose-Capillary: 264 mg/dL — ABNORMAL HIGH (ref 70–99)
Glucose-Capillary: 214 mg/dL — ABNORMAL HIGH (ref 70–99)
Glucose-Capillary: 296 mg/dL — ABNORMAL HIGH (ref 70–99)

## 2019-12-21 LAB — BASIC METABOLIC PANEL
Anion gap: 11 (ref 5–15)
BUN: 27 mg/dL — ABNORMAL HIGH (ref 8–23)
CO2: 25 mmol/L (ref 22–32)
Calcium: 8.9 mg/dL (ref 8.9–10.3)
Chloride: 95 mmol/L — ABNORMAL LOW (ref 98–111)
Creatinine, Ser: 0.6 mg/dL (ref 0.44–1.00)
GFR calc Af Amer: >60 mL/min (ref >60)
GFR calc non Af Amer: >60 mL/min (ref >60)
Glucose, Bld: 293 mg/dL — ABNORMAL HIGH (ref 70–99)
Potassium: 4.5 mmol/L (ref 3.5–5.1)
Sodium: 131 mmol/L — ABNORMAL LOW (ref 135–145)

## 2019-12-21 LAB — CBC
HCT: 37.8 % (ref 36.0–46.0)
Hemoglobin: 13 g/dL (ref 12.0–15.0)
MCH: 28.3 pg (ref 26.0–34.0)
MCHC: 34.4 g/dL (ref 30.0–36.0)
MCV: 82.4 fL (ref 80.0–100.0)
Platelets: 500 10*3/uL — ABNORMAL HIGH (ref 150–400)
RBC: 4.59 MIL/uL (ref 3.87–5.11)
RDW: 13.2 % (ref 11.5–15.5)
WBC: 12.6 10*3/uL — ABNORMAL HIGH (ref 4.0–10.5)
nRBC: 0 % (ref 0.0–0.2)

## 2019-12-21 LAB — D-DIMER, QUANTITATIVE: D-Dimer, Quant: 1.19 ug/mL-FEU — ABNORMAL HIGH (ref 0.00–0.50)

## 2019-12-21 LAB — MAGNESIUM: Magnesium: 2.1 mg/dL (ref 1.7–2.4)

## 2019-12-21 MED ORDER — SENNA 8.6 MG PO TABS: 1.0000 | ORAL_TABLET | Freq: Every day | ORAL | Status: DC | PRN

## 2019-12-21 MED ORDER — INSULIN DETEMIR 100 UNIT/ML ~~LOC~~ SOLN
20.0000 [IU] | Freq: Two times a day (BID) | SUBCUTANEOUS | Status: DC
  Administered 2019-12-21 – 2019-12-25 (×8): 20 [IU] via SUBCUTANEOUS
  Filled 2019-12-21 (×9): qty 0.2

## 2019-12-21 MED ORDER — FUROSEMIDE 10 MG/ML IJ SOLN
40.0000 mg | Freq: Two times a day (BID) | INTRAMUSCULAR | Status: AC
  Administered 2019-12-21 – 2019-12-22 (×3): 40 mg via INTRAVENOUS
  Filled 2019-12-21 (×2): qty 4

## 2019-12-21 MED ORDER — LACTULOSE 10 GM/15ML PO SOLN: 10.0000 g | Freq: Two times a day (BID) | ORAL | Status: DC | PRN

## 2019-12-21 NOTE — Progress Notes (Signed)
Patient deferred family update phone call. Patient on phone with family member offered to update daughter but patient verbalized that it was not necessary at this time. Janean Sark, RN

## 2019-12-21 NOTE — Progress Notes (Signed)
Pt is eating breakfast. Pt will return 15L NRB after eating

## 2019-12-21 NOTE — Progress Notes (Signed)
Patient reports that she is feeling much better overall. Has been in recliner most of the day. Appetite is fair. High O2 demands persists. Using NRB as needed.

## 2019-12-21 NOTE — Plan of Care (Signed)
  Problem: Education: Goal: Knowledge of risk factors and measures for prevention of condition will improve Outcome: Progressing   Problem: Coping: Goal: Psychosocial and spiritual needs will be supported Outcome: Progressing   Problem: Respiratory: Goal: Will maintain a patent airway Outcome: Progressing Goal: Complications related to the disease process, condition or treatment will be avoided or minimized Outcome: Progressing   Problem: Education: Goal: Ability to describe self-care measures that may prevent or decrease complications (Diabetes Survival Skills Education) will improve Outcome: Progressing   Problem: Coping: Goal: Ability to adjust to condition or change in health will improve Outcome: Progressing   Problem: Fluid Volume: Goal: Ability to maintain a balanced intake and output will improve Outcome: Progressing   Problem: Health Behavior/Discharge Planning: Goal: Ability to identify and utilize available resources and services will improve Outcome: Progressing Goal: Ability to manage health-related needs will improve Outcome: Progressing   Problem: Metabolic: Goal: Ability to maintain appropriate glucose levels will improve Outcome: Progressing   Problem: Nutritional: Goal: Maintenance of adequate nutrition will improve Outcome: Progressing Goal: Progress toward achieving an optimal weight will improve Outcome: Progressing

## 2019-12-21 NOTE — Progress Notes (Signed)
Spoke with patients daughter, Deanna Wiater, and gave an update on patients status, POC, and discharge plan. All questions answered at this time.

## 2019-12-21 NOTE — Progress Notes (Signed)
Carrie Black  RCV:[id number] DOB: 08/09/1956 DOA: 12/14/2019 PCP: System, Pcp Not In    Brief Narrative:  64 year old with a history of endometriosis who presented with a cough and fever of 2 weeks duration and was found to have oxygen saturations of 76% on room air. She tested + for COVID 1/22.   Significant Events: 1/8 approximate symptom onset  1/22 COVID+  1/23 admit via [place] -transferred to [facility] - confirmed COVID+  COVID-19 specific Treatment: Decadron 1/23 > 2/1 Remdesivir 1/23 > 1/27 Actemra 1/23 Convalescent plasma on 1/24  Antimicrobials:  none  Subjective: Continues to require 15 L high flow salter nasal cannula with saturations in the upper 80s/low 90s.  States she feels better today.  Indeed her respirations appear more comfortable.  Denies chest pain fevers chills nausea or vomiting.  Assessment & Plan:  COVID Pneumonia -acute hypoxic respiratory failure Continue Decadron - has completed a course of remdesivir -was dosed with Actemra and convalescent plasma - cont to gently diurese - counseled on need for proning, and religious use of IC - she is very motivated and cooperative - could still require HHFNC but may be stabilizing / slightly improved today     Recent Labs  Lab 12/14/19 1407 12/14/19 1407 12/15/19 0850 12/16/19 0200 12/17/19 0238 12/18/19 0240 12/18/19 0255 12/20/19 0245 12/21/19 0244  DDIMER 1.08*   < > 1.16* 1.14* 1.14*  --  1.18*  --  1.19*  FERRITIN 508*  --  527* 503* 456* 361*  --   --   --   CRP 19.9*  --  25.2* 15.4* 6.9* 3.1*  --   --   --   ALT 25   < > 26 23 22   --  25 18  --   PROCALCITON <0.10  --   --   --   --   --   --   --   --    < > = values in this interval not displayed.    Newly diagnosed DM 2 A1c 7.5 - CBG not yet at goal - adjust treatment again today and follow trend  DVT prophylaxis: Lovenox Code Status: FULL CODE Family Communication:  Disposition Plan: PCU - high risk for further decline    Consultants:  none  Objective: Blood pressure 120/65, pulse (!) 56, temperature 98.2 F (36.8 C), temperature source Oral, resp. rate 20, height 5\' 1"  (1.549 m), weight 63.5 kg, SpO2 (!) 87 %.  Intake/Output Summary (Last 24 hours) at 12/21/2019 0923 Last data filed at 12/21/2019 0340 Gross per 24 hour  Intake --  Output 300 ml  Net -300 ml   Filed Weights   12/14/19 1337  Weight: 63.5 kg    Examination: General: resp appear mildly less distressed - alert and conversant  Lungs: Fine crackles diffusely - no wheezing  Cardiovascular: RRR - no rub or M Abdomen: NT/ND, soft, bs+, no mass  Extremities: trace B LE edema   CBC: Recent Labs  Lab 12/16/19 0200 12/16/19 0200 12/17/19 0238 12/17/19 0238 12/18/19 0255 12/20/19 0245 12/21/19 0244  WBC 3.0*   < > 6.7   < > 8.1 10.4 12.6*  NEUTROABS 2.5  --  5.5  --  7.1  --   --   HGB 10.7*   < > 12.1   < > 12.4 12.9 13.0  HCT 32.8*   < > 37.5   < > 37.5 38.6 37.8  MCV 84.5   < > 84.5   < >  83.3 82.1 82.4  PLT 300   < > 420*   < > 452* 486* 500*   < > = values in this interval not displayed.   Basic Metabolic Panel: Recent Labs  Lab 12/16/19 0200 12/16/19 0200 12/17/19 0238 12/17/19 0238 12/18/19 0255 12/20/19 0245 12/21/19 0244  NA 139   < > 141   < > 140 132* 131*  K 4.1   < > 4.0   < > 3.9 4.5 4.5  CL 103   < > 103   < > 101 95* 95*  CO2 26   < > 27   < > 27 27 25   GLUCOSE 307*   < > 215*   < > 197* 301* 293*  BUN 18   < > 18   < > 21 23 27*  CREATININE 0.51   < > 0.50   < > 0.57 0.70 0.60  CALCIUM 9.1   < > 9.4   < > 9.2 8.8* 8.9  MG 2.1   < > 2.0   < > 2.2 2.3 2.1  PHOS 2.4*  --  3.9  --  3.6  --   --    < > = values in this interval not displayed.   GFR: Estimated Creatinine Clearance: 61.5 mL/min (by C-G formula based on SCr of 0.6 mg/dL).  Liver Function Tests: Recent Labs  Lab 12/16/19 0200 12/17/19 0238 12/18/19 0255 12/20/19 0245  AST 26 26 27 20   ALT 23 22 25 18   ALKPHOS 68 66 66 72   BILITOT 0.5 0.2* 0.9 0.8  PROT 7.1 7.1 7.0 6.6  ALBUMIN 3.2* 3.3* 3.3* 3.4*    HbA1C: Hgb A1c MFr Bld  Date/Time Value Ref Range Status  12/16/2019 02:00 AM 7.5 (H) 4.8 - 5.6 % Final    Comment:    (NOTE) Pre diabetes:          5.7%-6.4% Diabetes:              >6.4% Glycemic control for   <7.0% adults with diabetes   12/15/2019 08:50 AM 7.6 (H) 4.8 - 5.6 % Final    Comment:    (NOTE) Pre diabetes:          5.7%-6.4% Diabetes:              >6.4% Glycemic control for   <7.0% adults with diabetes     CBG: Recent Labs  Lab 12/20/19 1129 12/20/19 1651 12/20/19 1937 12/20/19 2106 12/21/19 0820  GLUCAP 233* 203* 305* 297* 189*    Recent Results (from the past 240 hour(s))  Blood Culture (routine x 2)     Status: None   Collection Time: 12/14/19  4:37 PM   Specimen: BLOOD RIGHT HAND  Result Value Ref Range Status   Specimen Description BLOOD RIGHT HAND  Final   Special Requests   Final    BOTTLES DRAWN AEROBIC AND ANAEROBIC Blood Culture adequate volume   Culture   Final    NO GROWTH 5 DAYS Performed at [facility], [place] [address], [place], [place] [address]    Report Status 12/19/2019 FINAL  Final  Blood Culture (routine x 2)     Status: None   Collection Time: 12/14/19  5:08 PM   Specimen: BLOOD  Result Value Ref Range Status   Specimen Description BLOOD RIGHT ANTECUBITAL  Final   Special Requests   Final    BOTTLES DRAWN AEROBIC AND ANAEROBIC Blood Culture adequate volume  Culture   Final    NO GROWTH 5 DAYS Performed at [facility], [address] [place], [place] [address]    Report Status 12/19/2019 FINAL  Final     Scheduled Meds: . vitamin C  500 mg Oral Daily  . dexamethasone (DECADRON) injection  6 mg Intravenous Q24H  . enoxaparin (LOVENOX) injection  40 mg Subcutaneous Q24H  . furosemide  40 mg Intravenous Daily  . insulin aspart  0-5 Units Subcutaneous QHS  . insulin aspart  0-9 Units Subcutaneous TID WC  . insulin detemir   18 Units Subcutaneous BID  . Ipratropium-Albuterol  1 puff Inhalation QID  . linagliptin  5 mg Oral Daily  . pantoprazole  40 mg Oral Daily  . Ensure Max Protein  11 oz Oral BID  . sodium chloride flush  3 mL Intravenous Q12H  . zinc sulfate  220 mg Oral Daily     LOS: 7 days   Lonia Blood, MD [facility] Office  [phone] Pager - Text Page per Loretha Stapler  If 7PM-7AM, please contact night-coverage per Amion 12/21/2019, 9:23 AM

## 2019-12-22 LAB — BASIC METABOLIC PANEL
Anion gap: 13 (ref 5–15)
BUN: 34 mg/dL — ABNORMAL HIGH (ref 8–23)
CO2: 23 mmol/L (ref 22–32)
Calcium: 8.9 mg/dL (ref 8.9–10.3)
Chloride: 96 mmol/L — ABNORMAL LOW (ref 98–111)
Creatinine, Ser: 0.53 mg/dL (ref 0.44–1.00)
GFR calc Af Amer: >60 mL/min (ref >60)
GFR calc non Af Amer: >60 mL/min (ref >60)
Glucose, Bld: 254 mg/dL — ABNORMAL HIGH (ref 70–99)
Potassium: 4.1 mmol/L (ref 3.5–5.1)
Sodium: 132 mmol/L — ABNORMAL LOW (ref 135–145)

## 2019-12-22 LAB — MAGNESIUM: Magnesium: 2.1 mg/dL (ref 1.7–2.4)

## 2019-12-22 LAB — GLUCOSE, CAPILLARY
Glucose-Capillary: 193 mg/dL — ABNORMAL HIGH (ref 70–99)
Glucose-Capillary: 255 mg/dL — ABNORMAL HIGH (ref 70–99)
Glucose-Capillary: 172 mg/dL — ABNORMAL HIGH (ref 70–99)
Glucose-Capillary: 373 mg/dL — ABNORMAL HIGH (ref 70–99)

## 2019-12-22 NOTE — Progress Notes (Signed)
Carrie Black  ESP:[id number] DOB: [birth year] DOA: 12/14/2019 PCP: System, Pcp Not In    Brief Narrative:  64 year old with a history of endometriosis who presented with a cough and fever of 2 weeks duration and was found to have oxygen saturations of 76% on room air. She tested + for COVID 1/22.   Significant Events: 1/8 approximate symptom onset  1/22 COVID+  1/23 admit via [place] -transferred to [facility] - confirmed COVID+  COVID-19 specific Treatment: Decadron 1/23 > 2/1 Remdesivir 1/23 > 1/27 Actemra 1/23 Convalescent plasma on 1/24  Antimicrobials:  none  Subjective: Over the last 24 hours the patient has weaned from 15 L high flow down to 8 L and is maintaining her saturations in the mid to upper 80s and lower 90s.  She has been very cooperative and spent the vast majority of yesterday sitting up in a chair or attempting to move about her room.  I spoke to the patient using the iPad interpreter today to assure that she had no questions, though she does speak English to a large extent.  Assessment & Plan:  COVID Pneumonia -acute hypoxic respiratory failure Continue Decadron - has completed a course of remdesivir -was dosed with Actemra and convalescent plasma - cont to diurese - counseled on need for proning, and religious use of IC - she is very motivated and cooperative -appears to be picking up speed in her recovery at this time  Newly diagnosed DM 2 A1c 7.5 - CBG trending down - follow w/o change today   DVT prophylaxis: Lovenox Code Status: FULL CODE Family Communication:  Disposition Plan: PCU - high risk for further decline   Consultants:  none  Objective: Blood pressure 119/72, pulse 70, temperature 98.1 F (36.7 C), temperature source Oral, resp. rate 20, height 5\' 1"  (1.549 m), weight 63.5 kg, SpO2 90 %. No intake or output data in the 24 hours ending 12/22/19 0844 Filed Weights   12/14/19 1337  Weight: 63.5 kg    Examination: General: Standing at  the sink brushing her teeth -no acute distress Lungs: Fine crackles diffusely without wheeze Cardiovascular: RRR without murmur Abdomen: NT/ND, soft, bs+, no mass  Extremities: No significant edema bilateral lower extremities  CBC: Recent Labs  Lab 12/16/19 0200 12/16/19 0200 12/17/19 0238 12/17/19 0238 12/18/19 0255 12/20/19 0245 12/21/19 0244  WBC 3.0*   < > 6.7   < > 8.1 10.4 12.6*  NEUTROABS 2.5  --  5.5  --  7.1  --   --   HGB 10.7*   < > 12.1   < > 12.4 12.9 13.0  HCT 32.8*   < > 37.5   < > 37.5 38.6 37.8  MCV 84.5   < > 84.5   < > 83.3 82.1 82.4  PLT 300   < > 420*   < > 452* 486* 500*   < > = values in this interval not displayed.   Basic Metabolic Panel: Recent Labs  Lab 12/16/19 0200 12/16/19 0200 12/17/19 0238 12/17/19 0238 12/18/19 0255 12/18/19 0255 12/20/19 0245 12/21/19 0244 12/22/19 0408  NA 139   < > 141   < > 140   < > 132* 131* 132*  K 4.1   < > 4.0   < > 3.9   < > 4.5 4.5 4.1  CL 103   < > 103   < > 101   < > 95* 95* 96*  CO2 26   < > 27   < >  27   < > 27 25 23   GLUCOSE 307*   < > 215*   < > 197*   < > 301* 293* 254*  BUN 18   < > 18   < > 21   < > 23 27* 34*  CREATININE 0.51   < > 0.50   < > 0.57   < > 0.70 0.60 0.53  CALCIUM 9.1   < > 9.4   < > 9.2   < > 8.8* 8.9 8.9  MG 2.1   < > 2.0   < > 2.2   < > 2.3 2.1 2.1  PHOS 2.4*  --  3.9  --  3.6  --   --   --   --    < > = values in this interval not displayed.   GFR: Estimated Creatinine Clearance: 61.5 mL/min (by C-G formula based on SCr of 0.53 mg/dL).  Liver Function Tests: Recent Labs  Lab 12/16/19 0200 12/17/19 0238 12/18/19 0255 12/20/19 0245  AST 26 26 27 20   ALT 23 22 25 18   ALKPHOS 68 66 66 72  BILITOT 0.5 0.2* 0.9 0.8  PROT 7.1 7.1 7.0 6.6  ALBUMIN 3.2* 3.3* 3.3* 3.4*    HbA1C: Hgb A1c MFr Bld  Date/Time Value Ref Range Status  12/16/2019 02:00 AM 7.5 (H) 4.8 - 5.6 % Final    Comment:    (NOTE) Pre diabetes:          5.7%-6.4% Diabetes:              >6.4% Glycemic  control for   <7.0% adults with diabetes   12/15/2019 08:50 AM 7.6 (H) 4.8 - 5.6 % Final    Comment:    (NOTE) Pre diabetes:          5.7%-6.4% Diabetes:              >6.4% Glycemic control for   <7.0% adults with diabetes     CBG: Recent Labs  Lab 12/21/19 0820 12/21/19 1150 12/21/19 1630 12/21/19 2045 12/22/19 0746  GLUCAP 189* 264* 214* 296* 193*    Recent Results (from the past 240 hour(s))  Blood Culture (routine x 2)     Status: None   Collection Time: 12/14/19  4:37 PM   Specimen: BLOOD RIGHT HAND  Result Value Ref Range Status   Specimen Description BLOOD RIGHT HAND  Final   Special Requests   Final    BOTTLES DRAWN AEROBIC AND ANAEROBIC Blood Culture adequate volume   Culture   Final    NO GROWTH 5 DAYS Performed at [facility], [place] [address] [place], [place] [address]    Report Status 12/19/2019 FINAL  Final  Blood Culture (routine x 2)     Status: None   Collection Time: 12/14/19  5:08 PM   Specimen: BLOOD  Result Value Ref Range Status   Specimen Description BLOOD RIGHT ANTECUBITAL  Final   Special Requests   Final    BOTTLES DRAWN AEROBIC AND ANAEROBIC Blood Culture adequate volume   Culture   Final    NO GROWTH 5 DAYS Performed at [facility], [place] [address] [place],  [address]    Report Status 12/19/2019 FINAL  Final     Scheduled Meds: . vitamin C  500 mg Oral Daily  . dexamethasone (DECADRON) injection  6 mg Intravenous Q24H  . enoxaparin (LOVENOX) injection  40 mg Subcutaneous Q24H  . furosemide  40  mg Intravenous BID  . insulin aspart  0-5 Units Subcutaneous QHS  . insulin aspart  0-9 Units Subcutaneous TID WC  . insulin detemir  20 Units Subcutaneous BID  . Ipratropium-Albuterol  1 puff Inhalation QID  . linagliptin  5 mg Oral Daily  . pantoprazole  40 mg Oral Daily  . Ensure Max Protein  11 oz Oral BID  . sodium chloride flush  3 mL Intravenous Q12H  . zinc sulfate  220 mg Oral Daily     LOS: 8 days    Lonia Blood, MD [facility] Office  [phone] Pager - Text Page per Loretha Stapler  If 7PM-7AM, please contact night-coverage per Amion 12/22/2019, 8:44 AM

## 2019-12-22 NOTE — Progress Notes (Signed)
Spoke with patients daughter, Thana Ramp, and gave an update on patients status, POC, and discharge plan. All questions answered at that time. Pt reports that she continues to feel stronger. Compliant with the use of IS and FV. Up to recliner most of shift. Currently on 10L HFNC and has not had to use NRB today. Will continue to monitor.

## 2019-12-22 NOTE — Plan of Care (Signed)
Pt on 8L HFNC, tolerating well. Still with some dyspnea O2 desaturation with exertion. Aware to call for assistance before getting out of bed. No changes noted during the night.  Problem: Education: Goal: Knowledge of risk factors and measures for prevention of condition will improve Outcome: Progressing   Problem: Coping: Goal: Psychosocial and spiritual needs will be supported Outcome: Progressing   Problem: Respiratory: Goal: Will maintain a patent airway Outcome: Progressing Goal: Complications related to the disease process, condition or treatment will be avoided or minimized Outcome: Progressing   Problem: Education: Goal: Ability to describe self-care measures that may prevent or decrease complications (Diabetes Survival Skills Education) will improve Outcome: Progressing Goal: Individualized Educational Video(s) Outcome: Progressing   Problem: Coping: Goal: Ability to adjust to condition or change in health will improve Outcome: Progressing   Problem: Fluid Volume: Goal: Ability to maintain a balanced intake and output will improve Outcome: Progressing   Problem: Health Behavior/Discharge Planning: Goal: Ability to identify and utilize available resources and services will improve Outcome: Progressing Goal: Ability to manage health-related needs will improve Outcome: Progressing   Problem: Metabolic: Goal: Ability to maintain appropriate glucose levels will improve Outcome: Progressing   Problem: Nutritional: Goal: Maintenance of adequate nutrition will improve Outcome: Progressing Goal: Progress toward achieving an optimal weight will improve Outcome: Progressing

## 2019-12-22 NOTE — Progress Notes (Signed)
Occupational Therapy Treatment Patient Details Name: Carrie Black MRN: [id number] DOB: [birth year] Today's Date: 12/22/2019    History of present illness Carrie Black is a 64 y.o. female with medical history significant of Covid test + December 13, 2019, endometriosis. Presented with  Cough, fever up to 100.4 have been reporting CP and SOB since yesterday. to care and was noted to be satting 76% on RA was placed on 5 L if her oxygenation improved to 90%.  She reports fevers at home up to 101.5 she has tried to take ibuprofen to control her fever.  She has been overall sick for the past 2 weeks but tested only positive yesterday. She has been treated by provider with prednisone  And azithromycin for the past few days but without improvement. The been seen in urgent care she was transported by EMS to [facility], ER   OT comments  Pt making progress in therapy, demonstrating improved independence and requiring less supplemental oxygen as compared to previous session. Pt on 10L HFNC with SpO2 89% at rest. Pt able to ambulate to/from bathroom without an assistive device, noting 0 instances of loss of balance. Pt completed toileting task, able to stand ~2 min at the sink to wash hands. SpO2 maintained at 89% following activity with pt reporting min shortness of breath. Pt reported dizziness throughout session (see below for BP details). Continued education with pt on breathing techniques with good understanding and follow through. Educated and provided pt with handout regarding energy conservation. Pt demonstrated good tolerance to tasks this date. OT will continue to follow acutely.   BP Sitting: 118/79mmHg. BP Standing: 119/58mmHg.   Follow Up Recommendations  No OT follow up;Supervision - Intermittent    Equipment Recommendations  None recommended by OT    Recommendations for Other Services      Precautions / Restrictions Precautions Precautions: Other (comment) Precaution Comments: Monitor  SpO2 Restrictions Weight Bearing Restrictions: No       Mobility Bed Mobility               General bed mobility comments: Pt seated in bedside chair upon OT arrival  Transfers Overall transfer level: Needs assistance Equipment used: None Transfers: Sit to/from Stand Sit to Stand: Supervision              Balance Overall balance assessment: Mild deficits observed, not formally tested                                         ADL either performed or assessed with clinical judgement   ADL Overall ADL's : Needs assistance/impaired     Grooming: Set up;Supervision/safety;Standing                   Toilet Transfer: Set up;Supervision/safety;Regular Toilet;Ambulation;Grab bars   Toileting- Clothing Manipulation and Hygiene: Set up;Supervision/safety;Sit to/from stand       Functional mobility during ADLs: Min guard General ADL Comments: Pt able to ambulate to/from bathroom without an assistive device. Noted 0 instances of LOB, however pt unsteady on feet.      Vision       Perception     Praxis      Cognition Arousal/Alertness: Awake/alert Behavior During Therapy: WFL for tasks assessed/performed Overall Cognitive Status: Within Functional Limits for tasks assessed  Exercises Exercises: Other exercises Other Exercises Other Exercises: Encouraged pursed lip breathing throughout   Shoulder Instructions       General Comments Pt on 10L HFNC with SpO2 89% at rest. SpO2 maintained at 89% with activity. Pt reports min SOB throughout.     Pertinent Vitals/ Pain       Pain Assessment: No/denies pain  Home Living                                          Prior Functioning/Environment              Frequency           Progress Toward Goals  OT Goals(current goals can now be found in the care plan section)  Progress towards OT goals:  Progressing toward goals  ADL Goals Pt Will Perform Grooming: Independently;standing Pt Will Perform Lower Body Bathing: Independently;sit to/from stand Pt Will Perform Lower Body Dressing: Independently;sit to/from stand Pt Will Transfer to Toilet: Independently;ambulating;regular height toilet Pt Will Perform Toileting - Clothing Manipulation and hygiene: Independently;sit to/from stand Additional ADL Goal #1: Pt to recall and verbalize 3 energy conservation strategies with 0 verbal cues. Additional ADL Goal #2: Pt to tolerate standing up to 10 min independently with SpO2 maintaining above 88%, in preparation for ADLs.  Plan Discharge plan remains appropriate    Co-evaluation                 AM-PAC OT "6 Clicks" Daily Activity     Outcome Measure   Help from another person eating meals?: None Help from another person taking care of personal grooming?: A Little Help from another person toileting, which includes using toliet, bedpan, or urinal?: A Little Help from another person bathing (including washing, rinsing, drying)?: A Little Help from another person to put on and taking off regular upper body clothing?: A Little Help from another person to put on and taking off regular lower body clothing?: A Little 6 Click Score: 19    End of Session Equipment Utilized During Treatment: Oxygen  OT Visit Diagnosis: Unsteadiness on feet (R26.81);Muscle weakness (generalized) (M62.81)   Activity Tolerance Patient limited by fatigue;Other (comment)(Limited by SOB)   Patient Left in chair;with call bell/phone within reach   Nurse Communication Mobility status        Time: 9935-7017 OT Time Calculation (min): 23 min  Charges: OT General Charges $OT Visit: 1 Visit OT Treatments $Self Care/Home Management : 8-22 mins $Therapeutic Activity: 8-22 mins  Peterson Ao OTR/L [phone]    Peterson Ao 12/22/2019, 1:39 PM

## 2019-12-22 NOTE — Plan of Care (Signed)
Patient Care Plan  Problem: Education: Goal: Knowledge of risk factors and measures for prevention of condition will improve Outcome: Progressing   Problem: Coping: Goal: Psychosocial and spiritual needs will be supported Outcome: Progressing   Problem: Respiratory: Goal: Will maintain a patent airway Outcome: Progressing Goal: Complications related to the disease process, condition or treatment will be avoided or minimized Outcome: Progressing   Problem: Education: Goal: Ability to describe self-care measures that may prevent or decrease complications (Diabetes Survival Skills Education) will improve Outcome: Progressing Goal: Individualized Educational Video(s) Outcome: Progressing   Problem: Coping: Goal: Ability to adjust to condition or change in health will improve Outcome: Progressing Problem: Fluid Volume: Goal: Ability to maintain a balanced intake and output will improve Outcome: Progressing   Problem: Health Behavior/Discharge Planning: Goal: Ability to identify and utilize available resources and services will improve Outcome: Progressing Goal: Ability to manage health-related needs will improve Outcome: Progressing   Problem: Metabolic: Goal: Ability to maintain appropriate glucose levels will improve Outcome: Progressing   Problem: Nutritional: Goal: Maintenance of adequate nutrition will improve Outcome: Progressing Goal: Progress toward achieving an optimal weight will improve Outcome: Progressing

## 2019-12-23 LAB — BASIC METABOLIC PANEL
Anion gap: 14 (ref 5–15)
BUN: 37 mg/dL — ABNORMAL HIGH (ref 8–23)
CO2: 24 mmol/L (ref 22–32)
Calcium: 9.2 mg/dL (ref 8.9–10.3)
Chloride: 92 mmol/L — ABNORMAL LOW (ref 98–111)
Creatinine, Ser: 0.65 mg/dL (ref 0.44–1.00)
GFR calc Af Amer: >60 mL/min (ref >60)
GFR calc non Af Amer: >60 mL/min (ref >60)
Glucose, Bld: 259 mg/dL — ABNORMAL HIGH (ref 70–99)
Potassium: 3.5 mmol/L (ref 3.5–5.1)
Sodium: 130 mmol/L — ABNORMAL LOW (ref 135–145)

## 2019-12-23 LAB — GLUCOSE, CAPILLARY
Glucose-Capillary: 179 mg/dL — ABNORMAL HIGH (ref 70–99)
Glucose-Capillary: 338 mg/dL — ABNORMAL HIGH (ref 70–99)
Glucose-Capillary: 181 mg/dL — ABNORMAL HIGH (ref 70–99)
Glucose-Capillary: 353 mg/dL — ABNORMAL HIGH (ref 70–99)

## 2019-12-23 MED ORDER — SALINE SPRAY 0.65 % NA SOLN
1.0000 | NASAL | Status: DC | PRN
  Administered 2019-12-23: 1 via NASAL
  Filled 2019-12-23: qty 44

## 2019-12-23 MED ORDER — FUROSEMIDE 10 MG/ML IJ SOLN
40.0000 mg | Freq: Two times a day (BID) | INTRAMUSCULAR | Status: AC
  Administered 2019-12-23 – 2019-12-24 (×3): 40 mg via INTRAVENOUS
  Filled 2019-12-23 (×3): qty 4

## 2019-12-23 MED ORDER — OXYMETAZOLINE HCL 0.05 % NA SOLN
1.0000 | Freq: Two times a day (BID) | NASAL | Status: AC
  Administered 2019-12-23 – 2019-12-24 (×4): 1 via NASAL
  Filled 2019-12-23: qty 15

## 2019-12-23 NOTE — Progress Notes (Signed)
PT Cancellation Note  Patient Details Name: Carrie Black MRN: [id number] DOB: [birth year]   Cancelled Treatment:    Reason Eval/Treat Not Completed: Other (comment)(Checked on x 2 and patient was not available for PT today) Harriett Rush, PT # [phone] CGV cell   Ephriam Jenkins 12/23/2019, 3:50 PM

## 2019-12-23 NOTE — Plan of Care (Signed)
Still on 10L HFNC, tolerating well. Does desat a little while up. O2 sats were only up to 93% during the night, so kept on 10L. Did get up with standby assistance to the restroom, tolerated very well. BG still elevated, but pt being treated with insulin. Water intake encouraged.  Problem: Education: Goal: Knowledge of risk factors and measures for prevention of condition will improve Outcome: Progressing   Problem: Coping: Goal: Psychosocial and spiritual needs will be supported Outcome: Progressing   Problem: Respiratory: Goal: Will maintain a patent airway Outcome: Progressing Goal: Complications related to the disease process, condition or treatment will be avoided or minimized Outcome: Progressing   Problem: Education: Goal: Ability to describe self-care measures that may prevent or decrease complications (Diabetes Survival Skills Education) will improve Outcome: Progressing Goal: Individualized Educational Video(s) Outcome: Progressing   Problem: Coping: Goal: Ability to adjust to condition or change in health will improve Outcome: Progressing   Problem: Fluid Volume: Goal: Ability to maintain a balanced intake and output will improve Outcome: Progressing   Problem: Health Behavior/Discharge Planning: Goal: Ability to identify and utilize available resources and services will improve Outcome: Progressing Goal: Ability to manage health-related needs will improve Outcome: Progressing   Problem: Metabolic: Goal: Ability to maintain appropriate glucose levels will improve Outcome: Progressing   Problem: Nutritional: Goal: Maintenance of adequate nutrition will improve Outcome: Progressing Goal: Progress toward achieving an optimal weight will improve Outcome: Progressing

## 2019-12-23 NOTE — Progress Notes (Signed)
Carrie Black  IRS:[id number] DOB: 1956/07/23 DOA: 12/14/2019 PCP: System, Pcp Not In    Brief Narrative:  64 year old with a history of endometriosis who presented with a cough and fever of 2 weeks duration and was found to have oxygen saturations of 76% on room air. She tested + for COVID 1/22.   Significant Events: 1/8 approximate symptom onset  1/22 COVID+  1/23 admit via [place] -transferred to [facility] - confirmed COVID+  COVID-19 specific Treatment: Decadron 1/23 > 2/1 Remdesivir 1/23 > 1/27 Actemra 1/23 Convalescent plasma on 1/24  Antimicrobials:  none  Subjective: Requiring 10 L high flow nasal cannula but has not required addition of nonrebreather mask over the last 24+ hours. Reports that she continues to feel better. Says she is less sob. Using her IS and up in chair, moving th/o the day frequently.   Assessment & Plan:  COVID Pneumonia -acute hypoxic respiratory failure Continue Decadron to complete a 10 day course - has completed a course of remdesivir -was dosed with Actemra and convalescent plasma - cont to diurese - counseled on need for proning, and religious use of IC - she is very motivated and cooperative   Newly diagnosed DM 2 A1c 7.5 - CBG remains variable - due to stop steroid tomorrow so will not change insulin at this time   DVT prophylaxis: Lovenox Code Status: FULL CODE Family Communication:  Disposition Plan: med/surg bed but not yet appropriate for 3rd floor   Consultants:  none  Objective: Blood pressure 112/70, pulse 67, temperature 98.4 F (36.9 C), temperature source Oral, resp. rate 20, height 5\' 1"  (1.549 m), weight 63.5 kg, SpO2 92 %. No intake or output data in the 24 hours ending 12/23/19 0810 Filed Weights   12/14/19 1337  Weight: 63.5 kg    Examination: General: no acute distress - able to complete full sentences  Lungs: Fine crackles diffusely - no wheezing  Cardiovascular: RRR  Abdomen: NT/ND, soft, bs+, no mass    Extremities: No edema B LE   CBC: Recent Labs  Lab 12/17/19 0238 12/17/19 0238 12/18/19 0255 12/20/19 0245 12/21/19 0244  WBC 6.7   < > 8.1 10.4 12.6*  NEUTROABS 5.5  --  7.1  --   --   HGB 12.1   < > 12.4 12.9 13.0  HCT 37.5   < > 37.5 38.6 37.8  MCV 84.5   < > 83.3 82.1 82.4  PLT 420*   < > 452* 486* 500*   < > = values in this interval not displayed.   Basic Metabolic Panel: Recent Labs  Lab 12/17/19 0238 12/17/19 0238 12/18/19 0255 12/18/19 0255 12/20/19 0245 12/20/19 0245 12/21/19 0244 12/22/19 0408 12/23/19 0332  NA 141   < > 140   < > 132*   < > 131* 132* 130*  K 4.0   < > 3.9   < > 4.5   < > 4.5 4.1 3.5  CL 103   < > 101   < > 95*   < > 95* 96* 92*  CO2 27   < > 27   < > 27   < > 25 23 24   GLUCOSE 215*   < > 197*   < > 301*   < > 293* 254* 259*  BUN 18   < > 21   < > 23   < > 27* 34* 37*  CREATININE 0.50   < > 0.57   < > 0.70   < >  0.60 0.53 0.65  CALCIUM 9.4   < > 9.2   < > 8.8*   < > 8.9 8.9 9.2  MG 2.0   < > 2.2   < > 2.3  --  2.1 2.1  --   PHOS 3.9  --  3.6  --   --   --   --   --   --    < > = values in this interval not displayed.   GFR: Estimated Creatinine Clearance: 61.5 mL/min (by C-G formula based on SCr of 0.65 mg/dL).  Liver Function Tests: Recent Labs  Lab 12/17/19 0238 12/18/19 0255 12/20/19 0245  AST 26 27 20   ALT 22 25 18   ALKPHOS 66 66 72  BILITOT 0.2* 0.9 0.8  PROT 7.1 7.0 6.6  ALBUMIN 3.3* 3.3* 3.4*    HbA1C: Hgb A1c MFr Bld  Date/Time Value Ref Range Status  12/16/2019 02:00 AM 7.5 (H) 4.8 - 5.6 % Final    Comment:    (NOTE) Pre diabetes:          5.7%-6.4% Diabetes:              >6.4% Glycemic control for   <7.0% adults with diabetes   12/15/2019 08:50 AM 7.6 (H) 4.8 - 5.6 % Final    Comment:    (NOTE) Pre diabetes:          5.7%-6.4% Diabetes:              >6.4% Glycemic control for   <7.0% adults with diabetes     CBG: Recent Labs  Lab 12/22/19 0746 12/22/19 1157 12/22/19 1602 12/22/19 2031  12/23/19 0748  GLUCAP 193* 255* 172* 373* 179*    Recent Results (from the past 240 hour(s))  Blood Culture (routine x 2)     Status: None   Collection Time: 12/14/19  4:37 PM   Specimen: BLOOD RIGHT HAND  Result Value Ref Range Status   Specimen Description BLOOD RIGHT HAND  Final   Special Requests   Final    BOTTLES DRAWN AEROBIC AND ANAEROBIC Blood Culture adequate volume   Culture   Final    NO GROWTH 5 DAYS Performed at [facility], [address], [place], [address] [place]    Report Status 12/19/2019 FINAL  Final  Blood Culture (routine x 2)     Status: None   Collection Time: 12/14/19  5:08 PM   Specimen: BLOOD  Result Value Ref Range Status   Specimen Description BLOOD RIGHT ANTECUBITAL  Final   Special Requests   Final    BOTTLES DRAWN AEROBIC AND ANAEROBIC Blood Culture adequate volume   Culture   Final    NO GROWTH 5 DAYS Performed at [facility], [address] [place], [address] [place]    Report Status 12/19/2019 FINAL  Final     Scheduled Meds: . vitamin C  500 mg Oral Daily  . dexamethasone (DECADRON) injection  6 mg Intravenous Q24H  . enoxaparin (LOVENOX) injection  40 mg Subcutaneous Q24H  . insulin aspart  0-5 Units Subcutaneous QHS  . insulin aspart  0-9 Units Subcutaneous TID WC  . insulin detemir  20 Units Subcutaneous BID  . Ipratropium-Albuterol  1 puff Inhalation QID  . linagliptin  5 mg Oral Daily  . pantoprazole  40 mg Oral Daily  . Ensure Max Protein  11 oz Oral BID  . sodium chloride flush  3 mL Intravenous Q12H  . zinc  sulfate  220 mg Oral Daily     LOS: 9 days   Cherene Altes, MD [facility] Office  [phone] Pager - Text Page per Amion  If 7PM-7AM, please contact night-coverage per Amion 12/23/2019, 8:10 AM

## 2019-12-23 NOTE — Progress Notes (Signed)
Inpatient Diabetes Program Recommendations  AACE/ADA: New Consensus Statement on Inpatient Glycemic Control   Target Ranges:  Prepandial:   less than 140 mg/dL      Peak postprandial:   less than 180 mg/dL (1-2 hours)      Critically ill patients:  140 - 180 mg/dL   Results for PROMYSE, ARDITO (MRN [id number]) as of 12/23/2019 08:59  Ref. Range 12/22/2019 07:46 12/22/2019 11:57 12/22/2019 16:02 12/22/2019 20:31 12/23/2019 07:48  Glucose-Capillary Latest Ref Range: 70 - 99 mg/dL 888 (H) 280 (H) 034 (H) 373 (H) 179 (H)   Review of Glycemic Control  Diabetes history: No Outpatient Diabetes medications: NA Current orders for Inpatient glycemic control: Levemir 20 units BID, Novolog 0-9 units TID with meals, Novolog 0-5 units QHS, Tradjenta 5 mg daily; Decadron 6 mg Q24H  Inpatient Diabetes Program Recommendations:   Insulin-Basal: If steroids are continued as ordered, please consider increasing Levemir to 22 units BID.  Thanks, Orlando Penner, RN, MSN, CDE Diabetes Coordinator Inpatient Diabetes Program [phone] (Team Pager from 8am to 5pm)

## 2019-12-23 NOTE — Plan of Care (Signed)
  Problem: Education: Goal: Knowledge of risk factors and measures for prevention of condition will improve Outcome: Progressing   Problem: Coping: Goal: Psychosocial and spiritual needs will be supported Outcome: Progressing   Problem: Respiratory: Goal: Will maintain a patent airway Outcome: Progressing   Problem: Education: Goal: Ability to describe self-care measures that may prevent or decrease complications (Diabetes Survival Skills Education) will improve Outcome: Progressing Goal: Individualized Educational Video(s) Outcome: Progressing   Problem: Coping: Goal: Ability to adjust to condition or change in health will improve Outcome: Progressing   Problem: Fluid Volume: Goal: Ability to maintain a balanced intake and output will improve Outcome: Progressing   Problem: Health Behavior/Discharge Planning: Goal: Ability to identify and utilize available resources and services will improve Outcome: Progressing Goal: Ability to manage health-related needs will improve Outcome: Progressing   Problem: Metabolic: Goal: Ability to maintain appropriate glucose levels will improve Outcome: Progressing   Problem: Nutritional: Goal: Maintenance of adequate nutrition will improve Outcome: Progressing Goal: Progress toward achieving an optimal weight will improve Outcome: Progressing   Problem: Respiratory: Goal: Complications related to the disease process, condition or treatment will be avoided or minimized Outcome: Not Progressing

## 2019-12-24 LAB — GLUCOSE, CAPILLARY
Glucose-Capillary: 179 mg/dL — ABNORMAL HIGH (ref 70–99)
Glucose-Capillary: 207 mg/dL — ABNORMAL HIGH (ref 70–99)
Glucose-Capillary: 293 mg/dL — ABNORMAL HIGH (ref 70–99)
Glucose-Capillary: 258 mg/dL — ABNORMAL HIGH (ref 70–99)

## 2019-12-24 MED ORDER — FUROSEMIDE 10 MG/ML IJ SOLN
40.0000 mg | Freq: Two times a day (BID) | INTRAMUSCULAR | Status: DC
  Administered 2019-12-24: 40 mg via INTRAVENOUS
  Filled 2019-12-24 (×2): qty 4

## 2019-12-24 NOTE — Progress Notes (Addendum)
Inpatient Diabetes Program Recommendations  AACE/ADA: New Consensus Statement on Inpatient Glycemic Control  Target Ranges:  Prepandial:   less than 140 mg/dL      Peak postprandial:   less than 180 mg/dL (1-2 hours)      Critically ill patients:  140 - 180 mg/dL   Results for Carrie Black, Carrie Black (MRN [id number]) as of 12/24/2019 14:49  Ref. Range 12/15/2019 08:50 12/16/2019 02:00  Hemoglobin A1C Latest Ref Range: 4.8 - 5.6 % 7.6 (H) 7.5 (H)    Review of Glycemic Control  Diabetes history:No Outpatient Diabetes medications:NA   Inpatient Diabetes Program Recommendations:  A1C:A1C 7.6% on 1/24/21indicating an average glucose of 171 mg/dl over the past 2-3 months.  MD, has noted that patient will be newly dx with DM during this admission.  NOTE:  MD notes patient will be newly dx with DM during this admission. Ordered Living Well with DM Spanish, RD consult for diet education, and patient education by bedside RNs on 12/17/19. Spoke with patient over the phone (with translator [phone]) about new diabetes diagnosis. Patient states that she has never been told she had DM or preDM. Patient states that [facility] and due to [place] she has not been able to go to the clinic in 2020 and have any blood work. She notes that she goes to [place] once a year and she usually has a physical when she is there.  Inquired about steroids noted on home medication list initially and patient confirms that she took Prednisone for 5 days prior to coming to the hospital.  Discussed A1C results (7.6% on 12/15/19) and explained what an A1C is and informed patient that current A1C indicates an average glucose of 171 mg/dl over the past 2-3 months. Discussed basic pathophysiology of DM Type 2, basic home care, importance of checking CBGs and maintaining good CBG control to prevent long-term and short-term complications. Reviewed glucose and A1C goals.  Reviewed signs and symptoms of hyperglycemia and  hypoglycemia along with treatment for both. Discussed impact of nutrition, exercise, stress, sickness, and medications on diabetes control. Informed patient that recent steroids have likely contributed to A1C and explained that current glucose is being impacted by steroids she was ordered.  Patient states that she has received Living Well with diabetes booklet and she has been reading the book. Encouraged patient to read through entire book to learn more about DM.  Informed patient that I will ask for TOC to provide a glucometer and testing supplies if they have one available but if not she can go to [employer] to purchase a Reli-On Classic glucometer for $9 and a box of 50 Reli-On test strips for $9. Encouraged patient to check glucose as MD directs, take DM medication (if prescribed at discharge), and follow up with the clinic regarding DM control.   Patient verbalized understanding of information discussed and she states that she has no further questions at this time related to diabetes.   RNs to provide ongoing basic DM education at bedside with this patient and engage patient to actively check blood glucose and administer insulin injections.   Spoke with [name], [facility] and she is going to take patient a Reli-On glucose kit.  Thanks, Barnie Alderman, RN, MSN, CDE Diabetes Coordinator Inpatient Diabetes Program [phone] (Team Pager from 8am to 5pm)

## 2019-12-24 NOTE — Progress Notes (Signed)
Carrie Black  ELF:[id number] DOB: December 02, 1955 DOA: 12/14/2019 PCP: System, Pcp Not In    Brief Narrative:  64 year old with a history of endometriosis who presented with a cough and fever of 2 weeks duration and was found to have oxygen saturations of 76% on room air. She tested + for COVID 1/22.   Significant Events: 1/8 approximate symptom onset  1/22 COVID+  1/23 admit via [place] -transferred to [facility] - confirmed COVID+  COVID-19 specific Treatment: Decadron 1/23 > 2/1 Remdesivir 1/23 > 1/27 Actemra 1/23 Convalescent plasma on 1/24  Antimicrobials:  none  Subjective: Is making consistent progress with patient now able to tolerate prolonged periods on room air while at rest.  The question remains how far she will desaturate with ambulation but hopefully within the next 24-48 hours her ambulatory oxygen requirement will be low enough to allow for her discharge home.  She denies chest pain nausea vomiting or abdominal pain.  Assessment & Plan:  COVID Pneumonia -acute hypoxic respiratory failure completed a 10 day course of decadron- has completed a course of remdesivir -was dosed with Actemra and convalescent plasma - cont to diurese - counseled on need for proning, and religious use of IC - she is very motivated and cooperative -improving with increasing speed at this time  Newly diagnosed DM 2 A1c 7.5 - CBG remains variable -has just recently finished her course of steroids -no change in treatment today but hope to be able to decrease insulin dosing soon  DVT prophylaxis: Lovenox Code Status: FULL CODE Family Communication:  Disposition Plan: med/surg bed -check ambulatory saturations -hopeful for discharge home within 24-48 hours if saturations with exertion allow  Consultants:  none  Objective: Blood pressure 114/64, pulse 78, temperature 98.1 F (36.7 C), temperature source Oral, resp. rate 16, height 5\' 1"  (1.549 m), weight 63.5 kg, SpO2 90 %.  Intake/Output  Summary (Last 24 hours) at 12/24/2019 0916 Last data filed at 12/24/2019 0300 Gross per 24 hour  Intake 2640 ml  Output --  Net 2640 ml   Filed Weights   12/14/19 1337  Weight: 63.5 kg    Examination: General: no acute distress - able to complete full sentences -sitting up at bedside without oxygen support Lungs: Fine crackles diffusely but less pronounced with no wheezing Cardiovascular: RRR  Abdomen: NT/ND, soft, bs+, no mass  Extremities: No edema B lower extremities  CBC: Recent Labs  Lab 12/18/19 0255 12/20/19 0245 12/21/19 0244  WBC 8.1 10.4 12.6*  NEUTROABS 7.1  --   --   HGB 12.4 12.9 13.0  HCT 37.5 38.6 37.8  MCV 83.3 82.1 82.4  PLT 452* 486* 500*   Basic Metabolic Panel: Recent Labs  Lab 12/18/19 0255 12/18/19 0255 12/20/19 0245 12/20/19 0245 12/21/19 0244 12/22/19 0408 12/23/19 0332  NA 140   < > 132*   < > 131* 132* 130*  K 3.9   < > 4.5   < > 4.5 4.1 3.5  CL 101   < > 95*   < > 95* 96* 92*  CO2 27   < > 27   < > 25 23 24   GLUCOSE 197*   < > 301*   < > 293* 254* 259*  BUN 21   < > 23   < > 27* 34* 37*  CREATININE 0.57   < > 0.70   < > 0.60 0.53 0.65  CALCIUM 9.2   < > 8.8*   < > 8.9 8.9 9.2  MG 2.2   < > 2.3  --  2.1 2.1  --   PHOS 3.6  --   --   --   --   --   --    < > = values in this interval not displayed.   GFR: Estimated Creatinine Clearance: 61.5 mL/min (by C-G formula based on SCr of 0.65 mg/dL).  Liver Function Tests: Recent Labs  Lab 12/18/19 0255 12/20/19 0245  AST 27 20  ALT 25 18  ALKPHOS 66 72  BILITOT 0.9 0.8  PROT 7.0 6.6  ALBUMIN 3.3* 3.4*    HbA1C: Hgb A1c MFr Bld  Date/Time Value Ref Range Status  12/16/2019 02:00 AM 7.5 (H) 4.8 - 5.6 % Final    Comment:    (NOTE) Pre diabetes:          5.7%-6.4% Diabetes:              >6.4% Glycemic control for   <7.0% adults with diabetes   12/15/2019 08:50 AM 7.6 (H) 4.8 - 5.6 % Final    Comment:    (NOTE) Pre diabetes:          5.7%-6.4% Diabetes:               >6.4% Glycemic control for   <7.0% adults with diabetes     CBG: Recent Labs  Lab 12/23/19 0748 12/23/19 1151 12/23/19 1620 12/23/19 2055 12/24/19 0731  GLUCAP 179* 338* 181* 353* 179*    Recent Results (from the past 240 hour(s))  Blood Culture (routine x 2)     Status: None   Collection Time: 12/14/19  4:37 PM   Specimen: BLOOD RIGHT HAND  Result Value Ref Range Status   Specimen Description BLOOD RIGHT HAND  Final   Special Requests   Final    BOTTLES DRAWN AEROBIC AND ANAEROBIC Blood Culture adequate volume   Culture   Final    NO GROWTH 5 DAYS Performed at [facility], [place] [address], [place], [place] [address]    Report Status 12/19/2019 FINAL  Final  Blood Culture (routine x 2)     Status: None   Collection Time: 12/14/19  5:08 PM   Specimen: BLOOD  Result Value Ref Range Status   Specimen Description BLOOD RIGHT ANTECUBITAL  Final   Special Requests   Final    BOTTLES DRAWN AEROBIC AND ANAEROBIC Blood Culture adequate volume   Culture   Final    NO GROWTH 5 DAYS Performed at [facility], [place] [address], [place],  [address]    Report Status 12/19/2019 FINAL  Final     Scheduled Meds: . vitamin C  500 mg Oral Daily  . enoxaparin (LOVENOX) injection  40 mg Subcutaneous Q24H  . insulin aspart  0-5 Units Subcutaneous QHS  . insulin aspart  0-9 Units Subcutaneous TID WC  . insulin detemir  20 Units Subcutaneous BID  . Ipratropium-Albuterol  1 puff Inhalation QID  . linagliptin  5 mg Oral Daily  . oxymetazoline  1 spray Each Nare BID  . pantoprazole  40 mg Oral Daily  . Ensure Max Protein  11 oz Oral BID  . sodium chloride flush  3 mL Intravenous Q12H  . zinc sulfate  220 mg Oral Daily     LOS: 10 days   Cherene Altes, MD [facility] Office  [phone] Pager - Text Page per Shea Evans  If 7PM-7AM, please contact night-coverage per Amion 12/24/2019, 9:16 AM

## 2019-12-24 NOTE — Plan of Care (Signed)
Pt tolerating RA. Independent in the room. Much improvement noted. Safety precautions in place. Problem: Education: Goal: Knowledge of risk factors and measures for prevention of condition will improve Outcome: Progressing   Problem: Coping: Goal: Psychosocial and spiritual needs will be supported Outcome: Progressing   Problem: Respiratory: Goal: Will maintain a patent airway Outcome: Progressing Goal: Complications related to the disease process, condition or treatment will be avoided or minimized Outcome: Progressing   Problem: Education: Goal: Ability to describe self-care measures that may prevent or decrease complications (Diabetes Survival Skills Education) will improve Outcome: Progressing Goal: Individualized Educational Video(s) Outcome: Progressing   Problem: Coping: Goal: Ability to adjust to condition or change in health will improve Outcome: Progressing   Problem: Fluid Volume: Goal: Ability to maintain a balanced intake and output will improve Outcome: Progressing   Problem: Health Behavior/Discharge Planning: Goal: Ability to identify and utilize available resources and services will improve Outcome: Progressing Goal: Ability to manage health-related needs will improve Outcome: Progressing   Problem: Metabolic: Goal: Ability to maintain appropriate glucose levels will improve Outcome: Progressing   Problem: Nutritional: Goal: Maintenance of adequate nutrition will improve Outcome: Progressing Goal: Progress toward achieving an optimal weight will improve Outcome: Progressing

## 2019-12-24 NOTE — Progress Notes (Signed)
Physical Therapy Treatment Patient Details Name: Carrie Black MRN: [id number] DOB: [birth year] Today's Date: 12/24/2019    History of Present Illness Carrie Black is a 64 y.o. female with medical history significant of Covid test + December 13, 2019, endometriosis. Presented with  Cough, fever up to 100.4 have been reporting CP and SOB since yesterday. to care and was noted to be satting 76% on RA was placed on 5 L if her oxygenation improved to 90%.  She reports fevers at home up to 101.5 she has tried to take ibuprofen to control her fever.  She has been overall sick for the past 2 weeks but tested only positive yesterday. She has been treated by provider with prednisone  And azithromycin for the past few days but without improvement. The been seen in urgent care she was transported by EMS to [facility], ER    PT Comments    Patient seated on edge of bed , while on RA. Physician arrived soon after PT entered to room He anticipates she will be discharged in next 1-2 days, and most likely will need low flow oxygen initially. Physician asked that PT ambulate and monitor to determined oxygen need in preparation for discharge to home.  She was able to maintain 91% for 175 feet then suddenly desatted to 84%. Did indicate she felt " a little short of breath"- Placed on 2 LPM, [place] for remainder of 125 feet back to room- recovered in 4 minutes to 94%. On return to room, wanted to sit on edge of bed and RN advised to leave her on RA. She was able to practice IS and Flutter valve. Should continue to benefit from skilled PT to further address strength and endurance.   Follow Up Recommendations  No PT follow up;Supervision - Intermittent;Supervision for mobility/OOB     Equipment Recommendations  None recommended by PT    Recommendations for Other Services       Precautions / Restrictions Precautions Precautions: Other (comment) Precaution Comments: Monitor SpO2..Ambulated for 175 feet, and she maintained 91,  then suddenly desatted to 84%- placed on 2 LPM, [place] and recovered to 94% in 4 min- maintained this the remainder of 125 feet back to room. Reported to RN Restrictions Weight Bearing Restrictions: No Other Position/Activity Restrictions: after ambulation and return to room, RN advised to leave on RA- she was 89% when PT left this room.    Mobility  Bed Mobility Overal bed mobility: Modified Independent             General bed mobility comments: Sitting on edge of bed when PT arrived. Physician spoke with her, and I advised that I was going to ambulate with her to monitor O2 sats- anticipates possible discharge in next 1-2 days.  Transfers Overall transfer level: Needs assistance Equipment used: None   Sit to Stand: Supervision Stand pivot transfers: Supervision       General transfer comment: set up and line management- Portable O2 tank and portable sensor to monitor O2  Ambulation/Gait Ambulation/Gait assistance: Supervision Gait Distance (Feet): 300 Feet Assistive device: None Gait Pattern/deviations: Step-through pattern   Gait velocity interpretation: 1.31 - 2.62 ft/sec, indicative of limited community ambulator General Gait Details: ambulated 175 while maintaining 91%, RA, then desatted to 84%- Applied 2 LPM, [place] and recovered to 94% in 4 minutes for remainder of 125 feet back to room. RN advised to leave on RA once in room and she was sitting on edge of bed when PT left room  Stairs             Wheelchair Mobility    Modified Rankin (Stroke Patients Only)       Balance Overall balance assessment: Mild deficits observed, not formally tested                                          Cognition Arousal/Alertness: Awake/alert Behavior During Therapy: WFL for tasks assessed/performed Overall Cognitive Status: Within Functional Limits for tasks assessed                                 General Comments: seems to be close to or  at baseline just apprehensive      Exercises Other Exercises Other Exercises: Encouraged pursed lip breathing throughout Other Exercises: flutter valve x 5 Other Exercises: IS x 10 and achieved 750 for 8 of 10 attempts. Other Exercises: Reminded to do UE/LE ex in between therapy sessions    General Comments        Pertinent Vitals/Pain Pain Assessment: No/denies pain    Home Living                      Prior Function            PT Goals (current goals can now be found in the care plan section) Acute Rehab PT Goals Patient Stated Goal: to go home PT Goal Formulation: With patient Time For Goal Achievement: 01/02/20 Potential to Achieve Goals: Good Progress towards PT goals: Progressing toward goals    Frequency    Min 3X/week      PT Plan Current plan remains appropriate    Co-evaluation              AM-PAC PT "6 Clicks" Mobility   Outcome Measure  Help needed turning from your back to your side while in a flat bed without using bedrails?: None Help needed moving from lying on your back to sitting on the side of a flat bed without using bedrails?: None Help needed moving to and from a bed to a chair (including a wheelchair)?: A Little Help needed standing up from a chair using your arms (e.g., wheelchair or bedside chair)?: A Little Help needed to walk in hospital room?: A Little Help needed climbing 3-5 steps with a railing? : A Lot 6 Click Score: 19    End of Session Equipment Utilized During Treatment: Oxygen Activity Tolerance: Patient tolerated treatment well Patient left: Other (comment);with call bell/phone within reach(sitting on edge of bed, RA when PT left room - 91% SPO2) Nurse Communication: Mobility status PT Visit Diagnosis: Other abnormalities of gait and mobility (R26.89);Muscle weakness (generalized) (M62.81)     Time: 7619-5093 PT Time Calculation (min) (ACUTE ONLY): 50 min  Charges:  $Gait Training: 23-37  mins $Therapeutic Exercise: 8-22 mins                    Rollen Sox, PT # [phone] CGV cell  Casandra Doffing 12/24/2019, 12:42 PM

## 2019-12-24 NOTE — Progress Notes (Signed)
Occupational Therapy Treatment Patient Details Name: Carrie Black MRN: [id number] DOB: Jul 24, 1956 Today's Date: 12/24/2019    History of present illness Carrie Black is a 64 y.o. female with medical history significant of Covid test + December 13, 2019, endometriosis. Presented with  Cough, fever up to 100.4 have been reporting CP and SOB since yesterday. to care and was noted to be satting 76% on RA was placed on 5 L if her oxygenation improved to 90%.  She reports fevers at home up to 101.5 she has tried to take ibuprofen to control her fever.  She has been overall sick for the past 2 weeks but tested only positive yesterday. She has been treated by provider with prednisone  And azithromycin for the past few days but without improvement. The been seen in urgent care she was transported by EMS to [facility], ER   OT comments  Pt continues to make progress in therapy, demonstrating improved activity tolerance and requiring less supplemental oxygen. Pt currently on room air as compared to 10L HFNC last session. Pt able to ambulate around room 1 x 3.5 min with supervision, noting 0 instances of loss of balance. Continued education with pt on safety strategies and energy conservation techniques, noting good recall from last session. Pt tolerated standing additional 1 x 8 min with supervision while engaging in standing ther ex. SpO2 dropped to 86% following mobility task with pt requiring ~1 min seated recovery to return to 90%. SpO2 dropped to 78% following ther ex with pt requiring ~3 min seated recovery to return to 88/89%. Pt reported min shortness of breath throughout. Pt demo good recall and use of pursed lip breathing during all tasks. RN updated. OT will continue to follow acutely.    Follow Up Recommendations  No OT follow up;Supervision - Intermittent    Equipment Recommendations  None recommended by OT    Recommendations for Other Services      Precautions / Restrictions  Precautions Precautions: Other (comment) Precaution Comments: Monitor SpO2 Restrictions Weight Bearing Restrictions: No Other Position/Activity Restrictions: after ambulation and return to room, RN advised to leave on RA- she was 89% when PT left this room.       Mobility Bed Mobility Overal bed mobility: Modified Independent             General bed mobility comments: Pt seated EOB upon OT arrival.  Transfers Overall transfer level: Needs assistance Equipment used: None Transfers: Sit to/from Stand Sit to Stand: Supervision Stand pivot transfers: Supervision       General transfer comment: to ensure balance and safety with line management    Balance Overall balance assessment: Mild deficits observed, not formally tested                                         ADL either performed or assessed with clinical judgement   ADL                                               Vision       Perception     Praxis      Cognition Arousal/Alertness: Awake/alert Behavior During Therapy: WFL for tasks assessed/performed Overall Cognitive Status: Within Functional Limits for tasks assessed  General Comments: seems to be close to or at baseline just apprehensive        Exercises Exercises: Other exercises Other Exercises Other Exercises: Pt demonstrated good use of pursed lip breathing throughout all tasks.  Other Exercises: flutter valve x 5 Other Exercises: IS x 10 and achieved 750 for 8 of 10 attempts. Other Exercises: Reminded to do UE/LE ex in between therapy sessions   Shoulder Instructions       General Comments Pt on room air with SpO2 decreasing to 86% and 78% during two standing activities. Pt required ~1 min to return to 90% following first activity. Pt required ~3 min to return to 88/89% with second activity. Pt reports min shortness of breath throughout.     Pertinent  Vitals/ Pain       Pain Assessment: No/denies pain  Home Living                                          Prior Functioning/Environment              Frequency           Progress Toward Goals  OT Goals(current goals can now be found in the care plan section)  Progress towards OT goals: Progressing toward goals  Acute Rehab OT Goals Patient Stated Goal: to go home ADL Goals Pt Will Perform Grooming: Independently;standing Pt Will Perform Lower Body Bathing: Independently;sit to/from stand Pt Will Perform Lower Body Dressing: Independently;sit to/from stand Pt Will Transfer to Toilet: Independently;ambulating;regular height toilet Pt Will Perform Toileting - Clothing Manipulation and hygiene: Independently;sit to/from stand Additional ADL Goal #1: Pt to recall and verbalize 3 energy conservation strategies with 0 verbal cues. Additional ADL Goal #2: Pt to tolerate standing up to 10 min independently with SpO2 maintaining above 88%, in preparation for ADLs.  Plan Discharge plan remains appropriate    Co-evaluation                 AM-PAC OT "6 Clicks" Daily Activity     Outcome Measure   Help from another person eating meals?: None Help from another person taking care of personal grooming?: A Little Help from another person toileting, which includes using toliet, bedpan, or urinal?: A Little Help from another person bathing (including washing, rinsing, drying)?: A Little Help from another person to put on and taking off regular upper body clothing?: A Little Help from another person to put on and taking off regular lower body clothing?: A Little 6 Click Score: 19    End of Session Equipment Utilized During Treatment: Other (comment)(none)  OT Visit Diagnosis: Unsteadiness on feet (R26.81);Muscle weakness (generalized) (M62.81)   Activity Tolerance Patient tolerated treatment well   Patient Left in chair;with call bell/phone within reach    Nurse Communication Mobility status        Time: 1202-1231 OT Time Calculation (min): 29 min  Charges: OT General Charges $OT Visit: 1 Visit OT Treatments $Therapeutic Activity: 8-22 mins $Therapeutic Exercise: 8-22 mins  Peterson Ao OTR/L [phone]    Peterson Ao 12/24/2019, 1:21 PM

## 2019-12-25 DIAGNOSIS — J9601 Acute respiratory failure with hypoxia: Secondary | ICD-10-CM

## 2019-12-25 LAB — GLUCOSE, CAPILLARY
Glucose-Capillary: 114 mg/dL — ABNORMAL HIGH (ref 70–99)
Glucose-Capillary: 189 mg/dL — ABNORMAL HIGH (ref 70–99)
Glucose-Capillary: 206 mg/dL — ABNORMAL HIGH (ref 70–99)
Glucose-Capillary: 157 mg/dL — ABNORMAL HIGH (ref 70–99)

## 2019-12-25 LAB — POTASSIUM: Potassium: 3.7 mmol/L (ref 3.5–5.1)

## 2019-12-25 LAB — COMPREHENSIVE METABOLIC PANEL
ALT: 23 U/L (ref 0–44)
AST: 22 U/L (ref 15–41)
Albumin: 3.7 g/dL (ref 3.5–5.0)
Alkaline Phosphatase: 76 U/L (ref 38–126)
Anion gap: 15 (ref 5–15)
BUN: 39 mg/dL — ABNORMAL HIGH (ref 8–23)
CO2: 27 mmol/L (ref 22–32)
Calcium: 9.1 mg/dL (ref 8.9–10.3)
Chloride: 89 mmol/L — ABNORMAL LOW (ref 98–111)
Creatinine, Ser: 0.7 mg/dL (ref 0.44–1.00)
GFR calc Af Amer: >60 mL/min (ref >60)
GFR calc non Af Amer: >60 mL/min (ref >60)
Glucose, Bld: 153 mg/dL — ABNORMAL HIGH (ref 70–99)
Potassium: 2.4 mmol/L — CL (ref 3.5–5.1)
Sodium: 131 mmol/L — ABNORMAL LOW (ref 135–145)
Total Bilirubin: 1.2 mg/dL (ref 0.3–1.2)
Total Protein: 6.3 g/dL — ABNORMAL LOW (ref 6.5–8.1)

## 2019-12-25 LAB — MAGNESIUM: Magnesium: 2.4 mg/dL (ref 1.7–2.4)

## 2019-12-25 MED ORDER — MAGNESIUM SULFATE IN D5W 1-5 GM/100ML-% IV SOLN
1.0000 g | Freq: Once | INTRAVENOUS | Status: AC
  Administered 2019-12-25: 1 g via INTRAVENOUS
  Filled 2019-12-25: qty 100

## 2019-12-25 MED ORDER — POTASSIUM CHLORIDE CRYS ER 20 MEQ PO TBCR
40.0000 meq | EXTENDED_RELEASE_TABLET | ORAL | Status: AC
  Administered 2019-12-25 (×2): 40 meq via ORAL
  Filled 2019-12-25: qty 2

## 2019-12-25 MED ORDER — POTASSIUM CHLORIDE CRYS ER 20 MEQ PO TBCR
40.0000 meq | EXTENDED_RELEASE_TABLET | Freq: Once | ORAL | Status: AC
  Administered 2019-12-25: 40 meq via ORAL
  Filled 2019-12-25: qty 2

## 2019-12-25 MED ORDER — POTASSIUM CHLORIDE 10 MEQ/100ML IV SOLN
10.0000 meq | INTRAVENOUS | Status: AC
  Administered 2019-12-25 (×2): 10 meq via INTRAVENOUS
  Filled 2019-12-25: qty 100

## 2019-12-25 MED ORDER — POTASSIUM CHLORIDE CRYS ER 20 MEQ PO TBCR
40.0000 meq | EXTENDED_RELEASE_TABLET | Freq: Two times a day (BID) | ORAL | Status: DC
  Filled 2019-12-25: qty 2

## 2019-12-25 NOTE — Progress Notes (Signed)
Carrie Black  TKZ:[id number] DOB: [birth year] DOA: 12/14/2019 PCP: System, Pcp Not In    Brief Narrative:  64 year old with a history of endometriosis who presented with a cough and fever of 2 weeks duration and was found to have oxygen saturations of 76% on room air. She tested + for COVID 1/22.   Significant Events: 1/8 approximate symptom onset  1/22 COVID+  1/23 admit via [place] -transferred to [facility] - confirmed COVID+  COVID-19 specific Treatment: Decadron 1/23 > 2/1 Remdesivir 1/23 > 1/27 Actemra 1/23 Convalescent plasma on 1/24  Antimicrobials:  none  Subjective: Patient states that she is feeling better.  Continues to have a cough with yellowish expectoration.  No chest pain.  No nausea vomiting.   Assessment & Plan:  Pneumonia due to COVID-19/acute respiratory failure with hypoxia Patient has completed course of remdesivir and steroids.  She was also given Actemra and convalescent plasma.  Patient has also received furosemide with good diuresis.  Will hold today since patient appears to be euvolemic.  Plus her potassium level was also noted to be significantly low.  Continue to mobilize.  Check ambulatory pulse oximetry.  Will likely need home oxygen.  Continue with incentive spirometry.    Newly diagnosed DM 2 HbA1c 7.5.  She has come off of steroids so anticipate some decrease in her glucose levels.  Cut back on the dose of her Levemir.  Stop meal coverage for now.  Continue SSI.  Hypokalemia and hyponatremia Stop diuretics.  Replace potassium aggressively.  Magnesium was given earlier this morning.  Recheck labs later today.  DVT prophylaxis: Lovenox Code Status: FULL CODE Family Communication: Discussed with patient.  Will update her daughter later today. Disposition Plan: Anticipate discharge tomorrow.  Will likely need home oxygen.  Consultants:  none  Objective: Blood pressure 103/65, pulse 84, temperature 97.6 F (36.4 C), temperature source Oral,  resp. rate 18, height 5\' 1"  (1.549 m), weight 63.5 kg, SpO2 91 %.  Intake/Output Summary (Last 24 hours) at 12/25/2019 1222 Last data filed at 12/25/2019 1000 Gross per 24 hour  Intake 1020 ml  Output --  Net 1020 ml   Filed Weights   12/14/19 1337  Weight: 63.5 kg    Examination:  General appearance: Awake alert.  In no distress Resp: Few crackles bilateral bases.  No wheezing or rhonchi.  Normal effort at rest. Cardio: S1-S2 is normal regular.  No S3-S4.  No rubs murmurs or bruit GI: Abdomen is soft.  Nontender nondistended.  Bowel sounds are present normal.  No masses organomegaly Extremities: No edema.  Full range of motion of lower extremities. Neurologic: Alert and oriented x3.  No focal neurological deficits.    CBC: Recent Labs  Lab 12/20/19 0245 12/21/19 0244  WBC 10.4 12.6*  HGB 12.9 13.0  HCT 38.6 37.8  MCV 82.1 82.4  PLT 486* 500*   Basic Metabolic Panel: Recent Labs  Lab 12/20/19 0245 12/20/19 0245 12/21/19 0244 12/21/19 0244 12/22/19 0408 12/23/19 0332 12/25/19 0248  NA 132*   < > 131*   < > 132* 130* 131*  K 4.5   < > 4.5   < > 4.1 3.5 2.4*  CL 95*   < > 95*   < > 96* 92* 89*  CO2 27   < > 25   < > 23 24 27   GLUCOSE 301*   < > 293*   < > 254* 259* 153*  BUN 23   < > 27*   < >  34* 37* 39*  CREATININE 0.70   < > 0.60   < > 0.53 0.65 0.70  CALCIUM 8.8*   < > 8.9   < > 8.9 9.2 9.1  MG 2.3  --  2.1  --  2.1  --   --    < > = values in this interval not displayed.   GFR: Estimated Creatinine Clearance: 61.5 mL/min (by C-G formula based on SCr of 0.7 mg/dL).  Liver Function Tests: Recent Labs  Lab 12/20/19 0245 12/25/19 0248  AST 20 22  ALT 18 23  ALKPHOS 72 76  BILITOT 0.8 1.2  PROT 6.6 6.3*  ALBUMIN 3.4* 3.7    HbA1C: Hgb A1c MFr Bld  Date/Time Value Ref Range Status  12/16/2019 02:00 AM 7.5 (H) 4.8 - 5.6 % Final    Comment:    (NOTE) Pre diabetes:          5.7%-6.4% Diabetes:              >6.4% Glycemic control for   <7.0% adults  with diabetes   12/15/2019 08:50 AM 7.6 (H) 4.8 - 5.6 % Final    Comment:    (NOTE) Pre diabetes:          5.7%-6.4% Diabetes:              >6.4% Glycemic control for   <7.0% adults with diabetes     CBG: Recent Labs  Lab 12/24/19 1139 12/24/19 1623 12/24/19 2027 12/25/19 0802 12/25/19 1125  GLUCAP 207* 293* 258* 114* 189*    No results found for this or any previous visit (from the past 240 hour(s)).   Scheduled Meds: . vitamin C  500 mg Oral Daily  . enoxaparin (LOVENOX) injection  40 mg Subcutaneous Q24H  . insulin aspart  0-5 Units Subcutaneous QHS  . insulin aspart  0-9 Units Subcutaneous TID WC  . insulin detemir  20 Units Subcutaneous BID  . Ipratropium-Albuterol  1 puff Inhalation QID  . linagliptin  5 mg Oral Daily  . pantoprazole  40 mg Oral Daily  . Ensure Max Protein  11 oz Oral BID  . sodium chloride flush  3 mL Intravenous Q12H  . zinc sulfate  220 mg Oral Daily     LOS: 11 days   [facility] Office  [phone] Pager - Text Page per Shea Evans  If 7PM-7AM, please contact night-coverage per Amion 12/25/2019, 12:22 PM

## 2019-12-25 NOTE — Progress Notes (Signed)
Ambulation Note  Saturation Pre: 91% on RA  Ambulation Distance: 230 ft  Saturation During Ambulation: 92-93% on 2L  Notes: Pt's SpO2 dropped to 86% upon standing up from the bed, place on 2L and SpO2 increased into the 90s and remained there. Pt walked independently. Tolerated well. Pt returned to bedside with call bell within reach, SpO2 93% on RA.   Alisia Ferrari, MS, ACSM CEP 12:26 PM 12/25/2019

## 2019-12-25 NOTE — Progress Notes (Signed)
SATURATION QUALIFICATIONS: (This note is used to comply with regulatory documentation for home oxygen)  Patient Saturations on Room Air at Rest = 91%  Patient Saturations on Room Air while Ambulating = 86%  Patient Saturations on 2 Liters of oxygen while Ambulating = 92%  Please briefly explain why patient needs home oxygen: Pt requires supplemental oxygen to maintain SpO2 at 88% and above while ambulating.

## 2019-12-25 NOTE — TOC Initial Note (Signed)
Transition of Care [facility]) - Initial/Assessment Note    Patient Details  Name: Carrie Black MRN: [id number] Date of Birth: November 27, 1955  Transition of Care [facility]) CM/SW Contact:    Shade Flood, LCSW Phone Number: 12/25/2019, 3:23 PM  Clinical Narrative:                  Pt admitted from home. MD anticipating dc home tomorrow with need for home O2. Pt does not have medical insurance. Spoke with Thedore Mins at [employer] to inquire about charity O2. Per Thedore Mins, they can likely provide for pt but he needs TOC to confirm with him in the AM. Spoke with Magda Paganini at [facility] and they are not doing charity O2 at this time. They will rent to pt for $219 a month for concentrator and portable tanks.   Pt does not have a PCP so medical follow up would need to be arranged with one of the providers for uninsured patients.   MATCH form completed for meds to bed provision of dc medications tomorrow.  TOC will follow up tomorrow.  Expected Discharge Plan: Home/Self Care Barriers to Discharge: Continued Medical Work up, Inadequate or no insurance   Patient Goals and CMS Choice        Expected Discharge Plan and Services Expected Discharge Plan: Home/Self Care In-house Referral: Clinical Social Work   Post Acute Care Choice: Durable Medical Equipment Living arrangements for the past 2 months: Single Family Home                 DME Arranged: Oxygen                    Prior Living Arrangements/Services Living arrangements for the past 2 months: Single Family Home Lives with:: Spouse Patient language and need for interpreter reviewed:: Yes          Care giver support system in place?: Yes (comment)   Criminal Activity/Legal Involvement Pertinent to Current Situation/Hospitalization: No - Comment as needed  Activities of Daily Living Home Assistive Devices/Equipment: None ADL Screening (condition at time of admission) Patient's cognitive ability adequate to safely complete daily activities?: Yes Is  the patient deaf or have difficulty hearing?: No Does the patient have difficulty seeing, even when wearing glasses/contacts?: No Does the patient have difficulty concentrating, remembering, or making decisions?: No Patient able to express need for assistance with ADLs?: Yes Does the patient have difficulty dressing or bathing?: No Independently performs ADLs?: Yes (appropriate for developmental age) Does the patient have difficulty walking or climbing stairs?: No Weakness of Legs: None Weakness of Arms/Hands: None  Permission Sought/Granted                  Emotional Assessment       Orientation: : Oriented to Self Alcohol / Substance Use: Not Applicable Psych Involvement: No (comment)  Admission diagnosis:  Hypoxia [R09.02] Pneumonia due to COVID-19 virus [U07.1, J12.82] Patient Active Problem List   Diagnosis Date Noted  . Diabetes mellitus type 2, uncontrolled, with complications ([place]) 64/33/2951  . Acute respiratory failure with hypoxia ([place]) 12/15/2019  . Pneumonia due to COVID-19 virus 12/14/2019  . Hypokalemia 12/14/2019  . Acute respiratory disease due to COVID-19 virus 12/14/2019   PCP:  System, Pcp Not In Pharmacy:   [place], [address] [place] [address] Phone: [phone] Fax: [place], [place]  [place] [address] Phone: [phone] Fax: [phone]     Social Determinants of Health (SDOH) Interventions    Readmission Risk Interventions Readmission Risk Prevention Plan 12/25/2019  Medication Screening Complete  Transportation Screening Complete

## 2019-12-25 NOTE — Progress Notes (Signed)
Physical Therapy Treatment Patient Details Name: Carrie Black MRN: [id number] DOB: 12-25-1955 Today's Date: 12/25/2019    PTHistory of Present Illness Carrie Black is a 64 y.o. female with medical history significant of Covid test + December 13, 2019, endometriosis. Presented with  Cough, fever up to 100.4 have been reporting CP and SOB since yesterday. to care and was noted to be satting 76% on RA was placed on 5 L if her oxygenation improved to 90%.  She reports fevers at home up to 101.5 she has tried to take ibuprofen to control her fever.  She has been overall sick for the past 2 weeks but tested only positive yesterday. She has been treated by provider with prednisone  And azithromycin for the past few days but without improvement. The been seen in urgent care she was transported by EMS to [facility], ER  PT comment: She was lying in bed attempting to take a nap when PT entered room. She had ambulated with Oxygen this AM, being monitored by Cristopher Peru PT (exercise physiologist). She did agree to perform exercises this afternoon, as she indicated she was tired. Practiced IS, Flutter valve unit, pursed lip breathing. Performed all LE ex as documented. She relates that she is very physically active at home- has a stationary bike. She did express concern about a lot of family members and friends all coming over to visit - she was not sure if any had had COVID or any other illnesses. Just advised her to be careful, that per the physician she was protected from getting it again for at least 2-3+ months. Should benefit from PT to further address goals to progress toward optimal functional outcomes.    At end of session, she remained in bed, bed in low position, call light in reach, covers in place.  Follow Up Recommendations  No PT follow up;Supervision - Intermittent;Supervision for mobility/OOB     Equipment Recommendations       Recommendations for Other Services       Precautions /  Restrictions Precautions Precautions: Other (comment) Precaution Comments: Monitor SpO2 Restrictions Weight Bearing Restrictions: No Other Position/Activity Restrictions: She ambulated with Exercise Physiologist (PHYSICAL THERAPY) Cristopher Peru. See ambulation note for today.    Mobility  Bed Mobility Overal bed mobility: Modified Independent             General bed mobility comments: Lying in bed, stating she was trying to take a nap. She had ambulated earlier (see ambulation note), but did agree to perform ex format.  Transfers Overall transfer level: Modified independent Equipment used: None Transfers: Sit to/from Stand Sit to Stand: Supervision Stand pivot transfers: Supervision       General transfer comment: to ensure balance and safety with line management(Did not repeat ambulation this afternoon. She was tired, but did agree to ex format.)  Ambulation/Gait                 Stairs             Wheelchair Mobility    Modified Rankin (Stroke Patients Only)       Balance Overall balance assessment: Mild deficits observed, not formally tested                                          Cognition Arousal/Alertness: Awake/alert Behavior During Therapy: WFL for tasks assessed/performed Overall Cognitive Status: Within Functional Limits  for tasks assessed                                 General Comments: seems to be close to or at baseline just apprehensive      Exercises Total Joint Exercises Ankle Circles/Pumps: AROM;Supine;Both;10 reps Quad Sets: AROM;Supine;Both;10 reps Hip ABduction/ADduction: AROM;Supine;10 reps Straight Leg Raises: AROM;Both;Supine Knee Flexion: AROM;Supine Other Exercises Other Exercises: Pt demonstrated good use of pursed lip breathing throughout all tasks.  Other Exercises: flutter valve x 10 Other Exercises: IS x 10 and achieved 750 for 8 of 10 attempts. Other Exercises: Reminded to  do UE/LE ex in between therapy sessions    General Comments General comments (skin integrity, edema, etc.): She was on room air throughout this PT session and maintained 85-> 94% throughout all exercises.      Pertinent Vitals/Pain Pain Assessment: No/denies pain    Home Living                      Prior Function            PT Goals (current goals can now be found in the care plan section) Acute Rehab PT Goals Patient Stated Goal: to go home PT Goal Formulation: With patient Time For Goal Achievement: 01/02/20 Potential to Achieve Goals: Good Progress towards PT goals: Progressing toward goals    Frequency    Min 3X/week      PT Plan Current plan remains appropriate    Co-evaluation              AM-PAC PT "6 Clicks" Mobility   Outcome Measure  Help needed turning from your back to your side while in a flat bed without using bedrails?: None Help needed moving from lying on your back to sitting on the side of a flat bed without using bedrails?: None Help needed moving to and from a bed to a chair (including a wheelchair)?: A Little Help needed standing up from a chair using your arms (e.g., wheelchair or bedside chair)?: A Little Help needed to walk in hospital room?: A Little Help needed climbing 3-5 steps with a railing? : A Little 6 Click Score: 20    End of Session   Activity Tolerance: Patient tolerated treatment well Patient left: Other (comment);with call bell/phone within reach;in bed(bed in low position, covers in place, [facility] elevated to @40  degrees)   PT Visit Diagnosis: Other abnormalities of gait and mobility (R26.89);Muscle weakness (generalized) (M62.81)     Time: 2353-6144 PT Time Calculation (min) (ACUTE ONLY): 35 min  Charges:  $Therapeutic Exercise: 23-37 mins                    Rollen Sox, PT # [phone] CGV cell   Casandra Doffing 12/25/2019, 3:10 PM

## 2019-12-25 NOTE — Plan of Care (Signed)
  Problem: Education: Goal: Knowledge of risk factors and measures for prevention of condition will improve Outcome: Progressing   Problem: Coping: Goal: Psychosocial and spiritual needs will be supported Outcome: Progressing   Problem: Respiratory: Goal: Will maintain a patent airway Outcome: Progressing Goal: Complications related to the disease process, condition or treatment will be avoided or minimized Outcome: Progressing   Problem: Education: Goal: Ability to describe self-care measures that may prevent or decrease complications (Diabetes Survival Skills Education) will improve Outcome: Progressing Goal: Individualized Educational Video(s) Outcome: Progressing   Problem: Coping: Goal: Ability to adjust to condition or change in health will improve Outcome: Progressing   Problem: Fluid Volume: Goal: Ability to maintain a balanced intake and output will improve Outcome: Progressing   Problem: Health Behavior/Discharge Planning: Goal: Ability to identify and utilize available resources and services will improve Outcome: Progressing Goal: Ability to manage health-related needs will improve Outcome: Progressing   Problem: Metabolic: Goal: Ability to maintain appropriate glucose levels will improve Outcome: Progressing   Problem: Nutritional: Goal: Maintenance of adequate nutrition will improve Outcome: Progressing Goal: Progress toward achieving an optimal weight will improve Outcome: Progressing

## 2019-12-26 LAB — COMPREHENSIVE METABOLIC PANEL
ALT: 25 U/L (ref 0–44)
AST: 22 U/L (ref 15–41)
Albumin: 3.5 g/dL (ref 3.5–5.0)
Alkaline Phosphatase: 62 U/L (ref 38–126)
Anion gap: 9 (ref 5–15)
BUN: 23 mg/dL (ref 8–23)
CO2: 24 mmol/L (ref 22–32)
Calcium: 8.6 mg/dL — ABNORMAL LOW (ref 8.9–10.3)
Chloride: 100 mmol/L (ref 98–111)
Creatinine, Ser: 0.54 mg/dL (ref 0.44–1.00)
GFR calc Af Amer: >60 mL/min (ref >60)
GFR calc non Af Amer: >60 mL/min (ref >60)
Glucose, Bld: 151 mg/dL — ABNORMAL HIGH (ref 70–99)
Potassium: 4.3 mmol/L (ref 3.5–5.1)
Sodium: 133 mmol/L — ABNORMAL LOW (ref 135–145)
Total Bilirubin: 1.2 mg/dL (ref 0.3–1.2)
Total Protein: 5.8 g/dL — ABNORMAL LOW (ref 6.5–8.1)

## 2019-12-26 LAB — CBC
HCT: 35.7 % — ABNORMAL LOW (ref 36.0–46.0)
Hemoglobin: 12.3 g/dL (ref 12.0–15.0)
MCH: 28.2 pg (ref 26.0–34.0)
MCHC: 34.5 g/dL (ref 30.0–36.0)
MCV: 81.9 fL (ref 80.0–100.0)
Platelets: 346 10*3/uL (ref 150–400)
RBC: 4.36 MIL/uL (ref 3.87–5.11)
RDW: 14.2 % (ref 11.5–15.5)
WBC: 9.8 10*3/uL (ref 4.0–10.5)
nRBC: 0 % (ref 0.0–0.2)

## 2019-12-26 LAB — MAGNESIUM: Magnesium: 2.3 mg/dL (ref 1.7–2.4)

## 2019-12-26 LAB — GLUCOSE, CAPILLARY: Glucose-Capillary: 128 mg/dL — ABNORMAL HIGH (ref 70–99)

## 2019-12-26 MED ORDER — METFORMIN HCL 500 MG PO TABS: 500.0000 mg | ORAL_TABLET | Freq: Two times a day (BID) | ORAL | 2 refills | Status: DC

## 2019-12-26 MED FILL — metFORMIN HCL 500 MG TABS: 500 | 30 days supply | Qty: 60 | Fill #0

## 2019-12-26 NOTE — Plan of Care (Signed)
  Problem: Education: Goal: Knowledge of risk factors and measures for prevention of condition will improve Outcome: Progressing   Problem: Coping: Goal: Psychosocial and spiritual needs will be supported Outcome: Progressing   Problem: Respiratory: Goal: Will maintain a patent airway Outcome: Progressing Goal: Complications related to the disease process, condition or treatment will be avoided or minimized Outcome: Progressing   Problem: Education: Goal: Ability to describe self-care measures that may prevent or decrease complications (Diabetes Survival Skills Education) will improve Outcome: Progressing Goal: Individualized Educational Video(s) Outcome: Progressing   Problem: Coping: Goal: Ability to adjust to condition or change in health will improve Outcome: Progressing   Problem: Fluid Volume: Goal: Ability to maintain a balanced intake and output will improve Outcome: Progressing   Problem: Health Behavior/Discharge Planning: Goal: Ability to identify and utilize available resources and services will improve Outcome: Progressing Goal: Ability to manage health-related needs will improve Outcome: Progressing   Problem: Metabolic: Goal: Ability to maintain appropriate glucose levels will improve Outcome: Progressing   Problem: Nutritional: Goal: Maintenance of adequate nutrition will improve Outcome: Progressing Goal: Progress toward achieving an optimal weight will improve Outcome: Progressing   

## 2019-12-26 NOTE — Discharge Summary (Signed)
[facility]  Physician Discharge Summary   Patient ID: Carrie Black MRN: [id number] DOB/AGE: [birth year] 64 y.o.  Admit date: 12/14/2019 Discharge date: 12/26/2019  PCP: System, Pcp Not In  DISCHARGE DIAGNOSES:  Pneumonia due to COVID-19 Acute respiratory failure with hypoxia, resolved Newly diagnosed diabetes mellitus type 2 Hypokalemia and hyponatremia  RECOMMENDATIONS FOR OUTPATIENT FOLLOW UP: 1. Outpatient follow-up with PCP to be arranged by transition of care team 2. Ambulatory referral to pulmonology    Home Health: None Equipment/Devices: None  CODE STATUS: Full code  DISCHARGE CONDITION: fair  Diet recommendation: Modified carbohydrate  INITIAL HISTORY: 64 year old with a history of endometriosis who presented with a cough and fever of 2 weeks duration and was found to have oxygen saturations of 76% on room air. She tested + for COVID 1/22.    HOSPITAL COURSE:   Pneumonia due to COVID-19/acute respiratory failure with hypoxia Patient has completed course of remdesivir and steroids.  She was also given Actemra and convalescent plasma.  Patient has also received furosemide with good diuresis.    Patient has improved.  Initially she was noted to desaturate with ambulation.  However she was tested again today by the nursing staff and after walking a long distance she maintained her saturations in the 90s on room air.  Based on there is she will not qualify for home oxygen.  Otherwise stable for discharge.  Ambulatory referral to pulmonology.  Newly diagnosed DM 2 HbA1c 7.5.  She has come off of steroids so anticipate some decrease in her glucose levels.    She will be discharged on Metformin  Hypokalemia and hyponatremia Likely due to diuretics.  Potassium was supplemented.  Improved.  No need for Lasix at discharge.    Overall stable.  Okay for discharge home today.   PERTINENT LABS:  The results of significant diagnostics from this hospitalization  (including imaging, microbiology, ancillary and laboratory) are listed below for reference.     Labs:    Basic Metabolic Panel: Recent Labs  Lab 12/20/19 0245 12/20/19 0245 12/21/19 0244 12/21/19 0244 12/22/19 0408 12/23/19 0332 12/25/19 0248 12/25/19 1402 12/26/19 0405  NA 132*   < > 131*  --  132* 130* 131*  --  133*  K 4.5   < > 4.5   < > 4.1 3.5 2.4* 3.7 4.3  CL 95*   < > 95*  --  96* 92* 89*  --  100  CO2 27   < > 25  --  23 24 27   --  24  GLUCOSE 301*   < > 293*  --  254* 259* 153*  --  151*  BUN 23   < > 27*  --  34* 37* 39*  --  23  CREATININE 0.70   < > 0.60  --  0.53 0.65 0.70  --  0.54  CALCIUM 8.8*   < > 8.9  --  8.9 9.2 9.1  --  8.6*  MG 2.3  --  2.1  --  2.1  --   --  2.4 2.3   < > = values in this interval not displayed.   Liver Function Tests: Recent Labs  Lab 12/20/19 0245 12/25/19 0248 12/26/19 0405  AST 20 22 22   ALT 18 23 25   ALKPHOS 72 76 62  BILITOT 0.8 1.2 1.2  PROT 6.6 6.3* 5.8*  ALBUMIN 3.4* 3.7 3.5   CBC: Recent Labs  Lab 12/20/19 0245 12/21/19 0244 12/26/19 0405  WBC 10.4 12.6* 9.8  HGB 12.9 13.0 12.3  HCT 38.6 37.8 35.7*  MCV 82.1 82.4 81.9  PLT 486* 500* 346   BNP: BNP (last 3 results) Recent Labs    12/14/19 1407  BNP 18.3    CBG: Recent Labs  Lab 12/25/19 0802 12/25/19 1125 12/25/19 1619 12/25/19 1953 12/26/19 0720  GLUCAP 114* 189* 206* 157* 128*     IMAGING STUDIES DG Chest Port 1 View  Result Date: 12/20/2019 CLINICAL DATA:  COVID pneumonia. EXAM: PORTABLE CHEST 1 VIEW COMPARISON:  12/14/2019. FINDINGS: Mild upper mediastinal prominence. This may be from AP technique and prominent great vessels. A process such as adenopathy cannot be excluded. Follow-up PA and lateral chest x-ray suggested. Heart size normal. Persistent prominent bilateral pulmonary infiltrates again noted. Slight improvement in aeration in lung bases noted from prior exam. New infiltrate noted in the left upper lung. No pleural effusion  or pneumothorax. IMPRESSION: 1. Mild upper mediastinal prominence. This may be from AP technique and prominent great vessels. A process such as adenopathy cannot be excluded. Follow-up PA and lateral chest x-ray suggested. 2. Persistent prominent bilateral pulmonary infiltrates again noted. Slight improvement in aeration of lung bases noted from prior exam. New infiltrate in the left upper lung noted. Electronically Signed   By: Maisie Fus  Register   On: 12/20/2019 06:25   DG Chest Port 1 View  Result Date: 12/14/2019 CLINICAL DATA:  Shortness of breath EXAM: PORTABLE CHEST 1 VIEW COMPARISON:  None FINDINGS: There is bilateral lower lobe and to lesser extent upper lobe airspace disease most concerning for multilobar pneumonia including atypical viral pneumonia. There is no pleural effusion or pneumothorax. The heart and mediastinal contours are unremarkable. There is no acute osseous abnormality. IMPRESSION: Bilateral lower lobe and to lesser extent upper lobe airspace disease most concerning for multilobar pneumonia including atypical viral pneumonia. Electronically Signed   By: Elige Ko   On: 12/14/2019 14:21    DISCHARGE EXAMINATION: Vitals:   12/25/19 1956 12/26/19 0358 12/26/19 0657 12/26/19 1003  BP: 121/67 (!) 100/58 (!) 102/57   Pulse: 99 87 72   Resp: 17 16 18    Temp: 98 F (36.7 C) 97.6 F (36.4 C) 97.8 F (36.6 C)   TempSrc: Oral Oral Oral   SpO2: (!) 89% 90% 91% 98%  Weight:      Height:       General appearance: Awake alert.  In no distress Resp: Improved effort.  Few crackles at the bases.  No wheezing or rhonchi. Cardio: S1-S2 is normal regular.  No S3-S4.  No rubs murmurs or bruit GI: Abdomen is soft.  Nontender nondistended.  Bowel sounds are present normal.  No masses organomegaly Extremities: No edema.  Full range of motion of lower extremities. Neurologic: Alert and oriented x3.  No focal neurological deficits.    DISPOSITION: Home  Discharge Instructions     Ambulatory referral to Pulmonology   Complete by: As directed    Follow up for covid 19 in 3-4 weeks. Being discharged on home o2   Call MD for:  difficulty breathing, headache or visual disturbances   Complete by: As directed    Call MD for:  extreme fatigue   Complete by: As directed    Call MD for:  persistant dizziness or light-headedness   Complete by: As directed    Call MD for:  persistant nausea and vomiting   Complete by: As directed    Call MD for:  severe uncontrolled pain   Complete by: As directed  Call MD for:  temperature >100.4   Complete by: As directed    Diet Carb Modified   Complete by: As directed    Discharge instructions   Complete by: As directed    Please take your medications as prescribed.  A referral has been sent to the lung specialist for follow-up in 3 to 4 weeks.  COVID 19 INSTRUCTIONS  - You are felt to be stable enough to no longer require inpatient monitoring, testing, and treatment, though you will need to follow the recommendations below: - Based on the CDC's non-test criteria for ending self-isolation: You may not return to work/leave the home until at least 21 days since symptom onset AND 3 days without a fever (without taking tylenol, ibuprofen, etc.) AND have improvement in respiratory symptoms. - Do not take NSAID medications (including, but not limited to, ibuprofen, advil, motrin, naproxen, aleve, goody's powder, etc.) - Follow up with your doctor in the next week via telehealth or seek medical attention right away if your symptoms get WORSE.  - Consider donating plasma after you have recovered (either 14 days after a negative test or 28 days after symptoms have completely resolved) because your antibodies to this virus may be helpful to give to others with life-threatening infections. Please go to the website [email or web address] if you would like to consider volunteering for plasma donation.    Directions for you at home:  Wear a  facemask You should wear a facemask that covers your nose and mouth when you are in the same room with other people and when you visit a healthcare provider. People who live with or visit you should also wear a facemask while they are in the same room with you.  Separate yourself from other people in your home As much as possible, you should stay in a different room from other people in your home. Also, you should use a separate bathroom, if available.  Avoid sharing household items You should not share dishes, drinking glasses, cups, eating utensils, towels, bedding, or other items with other people in your home. After using these items, you should wash them thoroughly with soap and water.  Cover your coughs and sneezes Cover your mouth and nose with a tissue when you cough or sneeze, or you can cough or sneeze into your sleeve. Throw used tissues in a lined trash can, and immediately wash your hands with soap and water for at least 20 seconds or use an alcohol-based hand rub.  Wash your [employer] your hands often and thoroughly with soap and water for at least 20 seconds. You can use an alcohol-based hand sanitizer if soap and water are not available and if your hands are not visibly dirty. Avoid touching your eyes, nose, and mouth with unwashed hands.  Directions for those who live with, or provide care at home for you:  Limit the number of people who have contact with the patient If possible, have only one caregiver for the patient. Other household members should stay in another home or place of residence. If this is not possible, they should stay in another room, or be separated from the patient as much as possible. Use a separate bathroom, if available. Restrict visitors who do not have an essential need to be in the home.  Ensure good ventilation Make sure that shared spaces in the home have good air flow, such as from an air conditioner or an opened window, weather  permitting.  Wash your hands often Wash your  hands often and thoroughly with soap and water for at least 20 seconds. You can use an alcohol based hand sanitizer if soap and water are not available and if your hands are not visibly dirty. Avoid touching your eyes, nose, and mouth with unwashed hands. Use disposable paper towels to dry your hands. If not available, use dedicated cloth towels and replace them when they become wet.  Wear a facemask and gloves Wear a disposable facemask at all times in the room and gloves when you touch or have contact with the patient's blood, body fluids, and/or secretions or excretions, such as sweat, saliva, sputum, nasal mucus, vomit, urine, or feces.  Ensure the mask fits over your nose and mouth tightly, and do not touch it during use. Throw out disposable facemasks and gloves after using them. Do not reuse. Wash your hands immediately after removing your facemask and gloves. If your personal clothing becomes contaminated, carefully remove clothing and launder. Wash your hands after handling contaminated clothing. Place all used disposable facemasks, gloves, and other waste in a lined container before disposing them with other household waste. Remove gloves and wash your hands immediately after handling these items.  Do not share dishes, glasses, or other household items with the patient Avoid sharing household items. You should not share dishes, drinking glasses, cups, eating utensils, towels, bedding, or other items with a patient who is confirmed to have, or being evaluated for, COVID-19 infection. After the person uses these items, you should wash them thoroughly with soap and water.  Wash laundry thoroughly Immediately remove and wash clothes or bedding that have blood, body fluids, and/or secretions or excretions, such as sweat, saliva, sputum, nasal mucus, vomit, urine, or feces, on them. Wear gloves when handling laundry from the patient. Read and  follow directions on labels of laundry or clothing items and detergent. In general, wash and dry with the warmest temperatures recommended on the label.  Clean all areas the individual has used often Clean all touchable surfaces, such as counters, tabletops, doorknobs, bathroom fixtures, toilets, phones, keyboards, tablets, and bedside tables, every day. Also, clean any surfaces that may have blood, body fluids, and/or secretions or excretions on them. Wear gloves when cleaning surfaces the patient has come in contact with. Use a diluted bleach solution (e.g., dilute bleach with 1 part bleach and 10 parts water) or a household disinfectant with a label that says EPA-registered for coronaviruses. To make a bleach solution at home, add 1 tablespoon of bleach to 1 quart (4 cups) of water. For a larger supply, add  cup of bleach to 1 gallon (16 cups) of water. Read labels of cleaning products and follow recommendations provided on product labels. Labels contain instructions for safe and effective use of the cleaning product including precautions you should take when applying the product, such as wearing gloves or eye protection and making sure you have good ventilation during use of the product. Remove gloves and wash hands immediately after cleaning.  Monitor yourself for signs and symptoms of illness Caregivers and household members are considered close contacts, should monitor their health, and will be asked to limit movement outside of the home to the extent possible. Follow the monitoring steps for close contacts listed on the symptom monitoring form.   If you have additional questions, contact your local health department or call the epidemiologist on call at [phone] (available 24/7). This guidance is subject to change. For the most up-to-date guidance from [facility], please refer to their website:  [email or web address]     You were cared  for by a hospitalist during your hospital stay. If you have any questions about your discharge medications or the care you received while you were in the hospital after you are discharged, you can call the unit and asked to speak with the hospitalist on call if the hospitalist that took care of you is not available. Once you are discharged, your primary care physician will handle any further medical issues. Please note that NO REFILLS for any discharge medications will be authorized once you are discharged, as it is imperative that you return to your primary care physician (or establish a relationship with a primary care physician if you do not have one) for your aftercare needs so that they can reassess your need for medications and monitor your lab values. If you do not have a primary care physician, you can call [phone] for a physician referral.   Increase activity slowly   Complete by: As directed         Allergies as of 12/26/2019      Reactions   Hydrocodone Itching   Hydrocodone-acetaminophen Itching   Morphine Itching      Medication List    TAKE these medications   ibuprofen 200 MG tablet Commonly known as: ADVIL Take 400-800 mg by mouth every 6 (six) hours as needed for fever or headache (pain).   metFORMIN 500 MG tablet Commonly known as: GLUCOPHAGE Take 1 tablet (500 mg total) by mouth 2 (two) times daily with a meal.          TOTAL DISCHARGE TIME: 35 minutes  Perle Gibbon [employer] on [email or web address]  12/26/2019, 11:01 AM

## 2019-12-26 NOTE — Discharge Instructions (Signed)
Contar carbohidratos y la diabetes   Por qu es importante el conteo de carbohidratos?  Carrie Black las porciones de carbohidratos ayuda a Freight forwarder nivel de glucosa (azcar) en su sangre para que se sienta mejor.  . El equilibrio entre los carbohidratos que come y Best boy determina el nivel de glucosa que tendr en la sangre despus de comer.  Carrie Black carbohidratos tambin le ayudar a planificar sus comidas.   Qu alimentos contienen carbohidratos?  Entre los alimentos con carbohidratos se incluyen:  . Panes, galletas saladas y cereales  . Pastas, arroz y granos  . Vegetales (verduras) con almidn, como papas, elote (maz o choclo) y chcharos (guisantes o arvejas)  . Frijoles (habichuelas) y legumbres  . Leche, leche de soya y Estate agent  . Carrie Black y jugos de fruta  . Dulces como pasteles, galletas, helados, mermeladas y jaleas   Porciones de carbohidratos  Al planificar comidas para la diabetes, recuerde que un alimento con 1 porcin de carbohidratos contiene aproximadamente 15 gramos de carbohidratos:  . Revise el tamao de las porciones con tazas y cucharas de medir o con una pesa de alimentos.  Carrie Black los Datos de Nutricin en las etiquetas de los alimentos para saber cuntos gramos de carbohidratos contienen los alimentos que come.   Los Carrie Black de este folleto muestran porciones que contienen cerca de 15 gramos de carbohidratos.    Consejos para planificar sus comidas  . Un Plan de Alimentacin indica cuntas porciones de carbohidratos consumir en sus comidas y refrigerios (snacks). Para muchos adultos es adecuado comer 3 a 5 porciones de carbohidratos en cada comida y de 1 a 2 porciones de carbohidratos, en cada refrigerio.  . En un Plan de Alimentacin diaria saludable, la mayora de los carbohidratos provienen de:  o Al menos 6 porciones de frutas y vegetales sin almidn  o Al menos 6 porciones de Engineer, manufacturing, frijoles y Photographer con almidn, con al menos 3 de  estas porciones de granos integrales (enteros)  o Al menos 2 porciones de Air traffic controller o productos lcteos  . Revise regularmente su nivel de glucosa en la sangre. Esto puede indicarle si necesita ajustar las horas a las que consume carbohidratos.  . Comer alimentos que contienen Carrie Black, como granos [place], y comer muy pocos alimentos salados es bueno para su salud.  . Coma 4 a 6 onzas de carne u otros alimentos con protenas (como hamburguesas de soya) cada da. Elija fuentes de protena bajas en grasa, como carne de res y de cerdo bajas en grasa, pollo, pescado, queso bajo en grasa o alimentos vegetarianos como la soya.  . Coma algunas grasas saludables, como aceite de Carrie Black, de canola y nueces.  . Coma muy pocas grasas saturadas. Estas grasas no son saludables y se Occupational psychologist, la crema y las carnes con mucha grasa, como el tocino (tocineta) y las salchichas o Photographer.  . Coma muy pocas o nada de grasas trans. Estas grasas no son saludables y se encuentran en todos los alimentos que contienen aceites "parcialmente hidrogenados" en su lista de ingredientes.   Consejos para leer etiquetas  En los Datos de Nutricin de las etiquetas aparece una lista con el total de gramos de carbohidratos en una porcin estndar. La porcin estndar puede ser mayor o menor que 1 porcin de carbohidratos. Para saber cuntas porciones de carbohidratos hay en un alimento:  . Primero mire el tamao de la porcin estndar de la etiqueta.  Carrie Black  verifique el total de gramos de carbohidratos. Esta es la cantidad de carbohidratos en 1 porcin estndar. Divida el total de gramos de carbohidratos por 15. Este nmero equivale al nmero de porciones de carbohidratos en 1 porcin estndar. Recuerde: 1 porcin de carbohidratos equivale a 15 gramos de carbohidratos.  . Nota: Puede ignorar los gramos de Carrie Black en los Datos de Nutricin, ya que estn incluidos en el total de gramos de carbohidratos.    Listas de  alimentos para el conteo de carbohidratos  1 porcin = cerca de 15 gramos de carbohidratos  Almidones  . 1 rebanada de pan (1 onza)  . 1 tortilla (6 pulgadas)  .  rosca de pan (bagel) grande (1 onza)  . 2 tortillas para taco (5 pulgadas)  .  pan para hamburguesa o para salchicha (hot dog) (3/4 onza)  .  taza de cereal listo para comer sin endulzar  .  taza de cereal cocido  . 1 taza de sopa a base de caldo  . 4-6 galletitas saladas  . ? taza de pasta o arroz (cocidos)  .  taza de frijoles, chcharos, granos de elote, camotes (batatas, boniatos), calabaza (zapallo), pur de papas o papas hervidas (cocidos)  .  papa grande asada (3 onzas)  .  onza de pretzels, papitas o totopos (tortilla chips)  . 3 tazas de palomitas de maz Best boy(popcorn) (ya preparadas)   Carrie PyleFrutas  . 1 fruta fresca pequea ( a 1 taza)  .  taza de fruta enlatada o congelada  . 17 uvas pequeas (3 onzas)  . 1 taza de meln, bayas (moras)  .  vaso de jugo de fruta  . 2 cucharadas de frutas secas (arndanos azules/blueberries, cerezas, arndanos rojos/cranberries, frutas surtidas, uvas pasas/pasitas)  Leche  . 1 taza de PPG Industriesleche descremada o reducida en grasa  . 1 taza de leche de soya  . ? taza de yogur descremado endulzado con un edulcorante sin azcar (6 onzas)   Dulces y postres  . pastel cuadrado de 2 pulgadas (sin betn/cobertura)  . 2 galletitas dulces (? onzas)  .  taza de helado o yogur congelado  .  taza de sorbete (sherbet) o nieve (sorbet)  . 1 cucharada de jarabe (sirope), mermelada, jalea, azcar o miel  . 2 cucharadas de jarabe bajo en caloras    Otros alimentos  . Cuente 1 taza de vegetales crudos o  taza de vegetales sin almidn, cocidas, como porciones de alimentos con cero (0) carbohidratos o "sin restriccin." Si come 3 o ms porciones en una comida, cuntelas como 1 porcin de carbohidratos.  . Los alimentos que contienen menos de 20 caloras en cada porcin tambin pueden contarse como  porciones con cero carbohidratos o alimentos "sin restriccin."  . Cuente 1 taza de guiso (estofado) u otros alimentos combinados como 2 porciones de carbohidratos.     Contar carbohidratos y la diabetes: Ejemplo de men para 1 da Desayuno  1 pltano/banana pequeo (1 carbohidrato)   taza de hojuelas de maz (cornflakes) (1 carbohidrato)  1 taza de leche descremada o baja en grasa (1 carbohidrato)  1 rebanada de pan de trigo integral (1 carbohidrato)  1 cucharadita de margarina   Almuerzo  2 onzas de rebanadas de [place] Hillpavo  2 rebanadas de pan de trigo integral (2 carbohidratos)  2 hojas de Company secretarylechuga  4 palitos de apio  4 palitos de zanahoria  1 manzana mediana (1 carbohidrato)  1 taza de leche descremada o baja en grasa (1 carbohidrato)  Refrigerio  2 cucharadas de uvas pasas/pasitas (1 carbohidrato)   onzas de mini pretzels sin sal (1 carbohidrato)   Cena  3 onzas de carne asada de res, magra   papa grande asada (2 carbohidratos)  1 cucharada de crema agria reducida en grasa   taza de ejotes/habichuelas verdes/chauchas  1 taza de ensalada de vegetales  1 cucharada de aderezo para ensaladas reducido en caloras  1 panecillo de trigo integral (1 carbohidrato)  1 cucharadita de margarina  1 taza de bolitas de meln (1 carbohidrato)   Refrigerio  6 onzas de yogur de frutas bajo en grasa, sin azcar (1 carbohidrato)  2 cucharadas de nueces sin sal        COVID-19 COVID-19 El COVID-19 es una infeccin respiratoria causada por un virus llamado coronavirus tipo 2 causante del sndrome respiratorio agudo grave (SARS-CoV-2). La enfermedad tambin se conoce como enfermedad por coronavirus o nuevo coronavirus. En algunas personas, el virus puede no ocasionar sntomas. En otras, puede producir una infeccin grave. La infeccin puede empeorar rpidamente y causar complicaciones, como:  Neumona o infeccin en los pulmones.  Sndrome de dificultad respiratoria aguda o SDRA. Es una afeccin  que se caracteriza por la acumulacin de lquido en los pulmones, que impide que los pulmones se llenen de aire y pasen oxgeno a Risk manager.  Insuficiencia respiratoria aguda. Es una afeccin que se caracteriza porque no pasa suficiente oxgeno de los pulmones al cuerpo o porque el dixido de carbono no pasa de los pulmones hacia afuera del cuerpo.  Sepsis o choque sptico. Se trata de una reaccin grave del cuerpo ante una infeccin.  Problemas de coagulacin.  Infecciones secundarias debido a bacterias u hongos.  Falla de rganos. Ocurre cuando los rganos del cuerpo dejan de funcionar. El virus que causa el COVID-19 es contagioso. Esto significa que puede transmitirse de [place] persona a otra a travs de las gotitas de saliva de la tos y de los estornudos (secreciones respiratorias). Cules son las causas? Esta enfermedad es causada por un virus. Usted puede contagiarse con este virus:  Al inspirar las gotitas de una persona infectada. Las BJ's pueden diseminarse cuando una persona respira, habla, canta, tose o estornuda.  Al tocar algo, como una mesa o el picaportes de [place], que estuvo expuesto al virus (contaminado) y luego tocarse la boca, nariz o los ojos. Qu incrementa el riesgo? Riesgo de infeccin Es ms probable que se infecte con este virus si:  Se encuentra a Social worker a 6 pies (2 metros) de Medical laboratory scientific officer con COVID-19.  Cuida o vive con una persona infectada con COVID-19.  Pasa tiempo en espacios interiores repletos de gente o vive en viviendas compartidas. Riesgo de enfermedad grave Es ms probable que se enferme gravemente por el virus si:  Tiene 50aos o ms. Cuanto mayor sea su edad, mayor ser el riesgo de tener una enfermedad grave.  Vive en un hogar de ancianos o centro de atencin a Air cabin crew.  Tiene cncer.  Tiene una enfermedad prolongada (crnica), como las siguientes: ? Enfermedad pulmonar crnica, que incluye la enfermedad pulmonar  obstructiva crnica o asma. ? Una enfermedad crnica que disminuye la capacidad del cuerpo para combatir las infecciones (immunocomprometido). ? Enfermedad cardaca, que incluye insuficiencia cardaca, una afeccin que se caracteriza porque las arterias que llegan al corazn se Engineer, technical sales u obstruyen (arteriopata coronaria) o una enfermedad que provoca que el msculo cardaco se engrose, se debilite o endurezca (miocardiopata). ? Diabetes. ? Enfermedad renal crnica. ?  Anemia drepanoctica, una enfermedad que se caracteriza porque los glbulos rojos tienen una forma anormal de "hoz". ? Enfermedad heptica.  Es obeso. Cules son los signos o sntomas? Los sntomas de esta afeccin pueden ser de leves a graves. Los sntomas pueden aparecer en el trmino de 2 a [address] despus de haber estado expuesto al virus. Incluyen los siguientes:  Fiebre o escalofros.  Tos.  Dificultad para respirar.  Dolores de [place], dolores en el cuerpo o dolores musculares.  Secrecin o congestin nasal.  Dolor de garganta.  Nueva prdida del sentido del gusto o del olfato. Algunas personas tambin pueden [employer]el, como nuseas, vmitos o diarrea. Es posible que otras personas no tengan sntomas de COVID-19. Cmo se diagnostica? Esta afeccin se puede diagnosticar en funcin de lo siguiente:  Sus signos y sntomas, especialmente si: ? Vive en una zona donde hay un brote de COVID-19. ? Viaj recientemente a una zona donde el virus es frecuente. ? Cuida o vive con Neomia Dear persona a quien se le diagnostic COVID-19. ? Carrie Black expuesto a una persona a la que se le diagnostic COVID-19.  Un examen fsico.  Anlisis de laboratorio que pueden incluir: ? Tomar una muestra de lquido de la parte posterior de la nariz y la garganta (lquido nasofarngeo), la nariz o la garganta, con un hisopo. ? Una muestra de mucosidad de los pulmones (esputo). ? Anlisis de [place].  Los estudios de  diagnstico por imgenes pueden incluir radiografas, exploracin por tomografa computarizada (TC) o ecografa. Cmo se trata? En este momento, no hay ningn medicamento para tratar el COVID-19. Los medicamentos para tratar otras enfermedades se usan a modo de ensayo para comprobar si son eficaces contra el COVID-19. El mdico le informar sobre las maneras de tratar los sntomas. En la [employer], la infeccin es leve y puede controlarse en el hogar con reposo, lquidos y medicamentos de [place]. El tratamiento para una infeccin grave suele realizarse en la unidad de cuidados intensivos (UCI) de un hospital. Puede incluir uno o ms de los siguientes. Estos tratamientos se administran hasta que los sntomas mejoran.  Recibir lquidos y [employer] a travs de una va intravenosa.  Oxgeno complementario. Para administrar oxgeno extra, se [place] un tubo en la Darene Lamer, una mascarilla o una campana de oxgeno.  Colocarlo para que se recueste boca abajo (decbito prono). Esto facilita el ingreso de oxgeno a los pulmones.  Uso continuo de [place] de presin positiva de las vas areas (CPAP) o de presin positiva de las vas areas de dos niveles (BPAP). Este tratamiento utiliza una presin de aire leve para Pharmacologist las vas respiratorias abiertas. Un tubo conectado a un motor administra oxgeno al cuerpo.  Respirador. Este tratamiento mueve el aire dentro y fuera de los pulmones mediante el uso de un tubo que se coloca en la trquea.  Traqueostoma. En este procedimiento se hace un orificio en el cuello para insertar un tubo de respiracin.  Oxigenacin por membrana extracorprea (OMEC). En este procedimiento, los pulmones tienen la posibilidad de recuperarse al asumir las funciones del corazn y los pulmones. Suministra oxgeno al cuerpo y elimina el dixido de carbono. Siga estas instrucciones en su casa: Estilo de vida  Si est enfermo, qudese en su casa, excepto para  obtener atencin mdica. El mdico le indicar cunto tiempo debe quedarse en casa. Llame al mdico antes de buscar atencin mdica.  Haga reposo en su casa como se lo haya indicado el mdico.  No consuma  ningn producto que contenga nicotina o tabaco, como cigarrillos, cigarrillos electrnicos y tabaco de Theatre manager. Si necesita ayuda para dejar de fumar, consulte al mdico.  Retome sus actividades normales segn lo indicado por el mdico. Pregntele al mdico qu actividades son seguras para usted. Instrucciones generales  Use los medicamentos de venta libre y los recetados solamente como se lo haya indicado el mdico.  Beba suficiente lquido como para Pharmacologist la orina de color amarillo plido.  Concurra a todas las visitas de [address] se lo haya indicado el mdico. Esto es importante. Cmo se evita?  No hay ninguna vacuna que ayude a prevenir la infeccin por COVID-19. Sin embargo, hay medidas que puede tomar para protegerse y Conservator, museum/gallery a Economist de este virus. Para protegerse:   No viaje a zonas donde el COVID-19 sea un riesgo. Las zonas donde se informa la presencia del COVID-19 cambian con frecuencia. Para identificar las zonas de alto riesgo y las restricciones de viaje, consulte el sitio web de viajes de Building control surveyor for [employer] and Prevention Insurance claims handler) (Centros para el Control y la Prevencin de Event organiser): StageSync.si  Si vive o debe viajar a una zona donde el COVID-19 es un riesgo, tome precauciones para evitar infecciones. ? Aljese de [employer]. ? Lvese las manos frecuentemente con agua y [place]. Use desinfectante para manos con alcohol si no dispone de [place] y [place]. ? Evite tocarse la boca, la cara, los ojos o la [place]. ? Evite salir de su casa, siga las indicaciones de su estado y de las autoridades sanitarias locales. ? Si debe salir de su casa, use un barbijo de tela o una mascarilla facial. Asegrese de  que le cubra la nariz y la boca. ? Evite los espacios interiores repletos de gente. Mantenga una distancia de al menos 6 pies (2 metros) de Economist. ? Desinfecte los objetos y las superficies que se tocan con frecuencia todos [place]. Pueden incluir:  Encimeras y [place].  Picaportes e interruptores de luz.  Lavabos, fregaderos y grifos.  Aparatos electrnicos tales como telfonos, controles remotos, teclados, computadoras y tabletas. Cmo proteger a los dems: Si tiene sntomas de COVID-19, tome medidas para evitar que el virus se propague a Economist.  Si cree que tiene una infeccin por COVID-19, comunquese de inmediato con su mdico. Informe al equipo de atencin mdica que cree que puede tener una infeccin por el COVID-19.  Qudese en su casa. Salga de su casa solo para buscar atencin mdica. No utilice el transporte pblico.  No viaje mientras est enfermo.  Lvese las manos frecuentemente con agua y [place]. Usar desinfectante para manos con alcohol si no dispone de [place] y [place].  Mantngase alejado de quienes vivan con usted. Permita que los miembros de la familia sanos cuiden a los nios y las Carrie Black, si es posible. Si tiene que cuidar a los nios o las mascotas, lvese las manos con frecuencia y use un barbijo. Si es posible, permanezca en su habitacin, separado de los dems. Utilice un bao diferente.  Asegrese de que todas las personas que viven en su casa se laven bien las manos y con frecuencia.  Tosa o estornude en un pauelo de papel o sobre su manga o codo. No tosa o estornude al aire ni se cubra la boca o la nariz con la [place].  Use un barbijo de tela o una mascarilla facial. Asegrese de que le cubra la nariz y la boca. Dnde buscar  ms informacin  Centers for Disease Control and Prevention (Centros para el Control y la Prevencin de Event organiser): StickerEmporium.tn  World Health Organization  (Organizacin Mundial de la Salud): https://thompson-craig.com/ Comunquese con un mdico si:  Vive o ha viajado a una zona donde el COVID-19 es un riesgo y tiene sntomas de infeccin.  Ha tenido contacto con alguien que tiene COVID-19 y usted tiene sntomas de infeccin. Solicite ayuda inmediatamente si:  Tiene dificultad para respirar.  Siente dolor u opresin en el pecho.  Experimenta confusin.  Tiene las uas de los dedos y los labios de color [place].  Tiene dificultad para despertarse.  Los sntomas empeoran. Estos sntomas pueden representar un problema grave que constituye Radio broadcast assistant. No espere a ver si los sntomas desaparecen. Solicite atencin mdica de inmediato. Comunquese con el servicio de emergencias de su localidad (911 en los [place]). No conduzca por sus propios medios [employer] hospital. Informe al personal mdico de emergencias si cree que tiene COVID-19. Resumen  El COVID-19 es una infeccin respiratoria causada por un virus. Tambin se conoce como enfermedad por coronavirus o nuevo coronavirus. Puede causar infecciones graves, como neumona, sndrome de dificultad respiratoria aguda, insuficiencia respiratoria aguda o sepsis.  El virus que causa el COVID-19 es contagioso. Esto significa que puede transmitirse de [place] persona a otra a travs de las gotitas que se despiden al respirar, Heritage manager, cantar, toser y Engineering geologist.  Es ms probable que desarrolle una enfermedad grave si tiene 50 aos o ms, tiene el sistema inmunitario dbil, vive en un hogar de ancianos o tiene una enfermedad crnica.  No hay ningn medicamento para tratar el COVID-19. El mdico le informar sobre las maneras de tratar los sntomas.  Tome medidas para protegerse y Conservator, museum/gallery a los Merchandiser, retail las infecciones. Lvese las manos con frecuencia y desinfecte los objetos y las superficies que se tocan con frecuencia todos [place]. Mantngase alejado de las personas que estn  enfermas y use un barbijo si est enfermo. Esta informacin no tiene Theme park manager el consejo del mdico. Asegrese de hacerle al mdico cualquier pregunta que tenga. Document Revised: 09/12/2019 Document Reviewed: 01/05/2019 Elsevier Patient Education  2020 [employer].

## 2019-12-26 NOTE — TOC Transition Note (Addendum)
Transition of Care [facility]) - CM/SW Discharge Note   Patient Details  Name: Carrie Black MRN: [id number] Date of Birth: 1956/10/08  [facility] CM/SW Contact:  Elliot Gault, LCSW Phone Number: 12/26/2019, 9:56 AM   Clinical Narrative:     Pt stable for dc. Based on O2 sat study, pt does not need home O2. There are no follow up PT/OT or DME recommendations.   Pt does not have a PCP and needs follow up care. Referred this uninsured pt to the [facility]. They will have a tele visit with her on 01/15/20 at 1:00. Updated pt's daughter and added to pt's AVS.  No other TOC needs for dc identified.   Final next level of care: Home/Self Care Barriers to Discharge: Barriers Resolved   Patient Goals and CMS Choice        Discharge Placement                       Discharge Plan and Services In-house Referral: Clinical Social Work   Post Acute Care Choice: Durable Medical Equipment          DME Arranged: Oxygen                    Social Determinants of Health (SDOH) Interventions     Readmission Risk Interventions Readmission Risk Prevention Plan 12/25/2019  Medication Screening Complete  Transportation Screening Complete

## 2019-12-26 NOTE — Progress Notes (Signed)
SATURATION QUALIFICATIONS: (This note is used to comply with regulatory documentation for home oxygen)  Patient Saturations on Room Air at Rest =91%  Patient Saturations on Room Air while Ambulating 92 %  Patient Saturations on 2 Liters of oxygen while Ambulating = 94%  Please briefly explain why patient needs home oxygen:  Pt becomes slightly short of breath with ambulating.

## 2020-01-15 ENCOUNTER — Ambulatory Visit (INDEPENDENT_AMBULATORY_CARE_PROVIDER_SITE_OTHER): Payer: Self-pay | Admitting: Nurse Practitioner

## 2020-01-15 ENCOUNTER — Encounter: Payer: Self-pay | Admitting: Nurse Practitioner

## 2020-01-15 DIAGNOSIS — IMO0002 Reserved for concepts with insufficient information to code with codable children: Secondary | ICD-10-CM

## 2020-01-15 DIAGNOSIS — U071 COVID-19: Secondary | ICD-10-CM

## 2020-01-15 DIAGNOSIS — E1165 Type 2 diabetes mellitus with hyperglycemia: Secondary | ICD-10-CM

## 2020-01-15 DIAGNOSIS — E118 Type 2 diabetes mellitus with unspecified complications: Secondary | ICD-10-CM

## 2020-01-15 DIAGNOSIS — J069 Acute upper respiratory infection, unspecified: Secondary | ICD-10-CM

## 2020-01-15 MED ORDER — METFORMIN HCL 500 MG PO TABS: 500.0000 mg | ORAL_TABLET | Freq: Two times a day (BID) | ORAL | 2 refills | Status: AC

## 2020-01-15 NOTE — Progress Notes (Signed)
Virtual Visit via Telephone Note  I connected with Navayah Sok on 01/15/20 at  1:00 PM EST by telephone and verified that I am speaking with the correct person using two identifiers.   I discussed the limitations, risks, security and privacy concerns of performing an evaluation and management service by telephone and the availability of in person appointments. I also discussed with the patient that there may be a patient responsible charge related to this service. The patient expressed understanding and agreed to proceed.   History of Present Illness: She is establishing care.  She was recently hospitalized for hypoxia related to COVID-19.  She  has a past medical history of Endometriosis. She denies a history of diabetes. She states that it was related to steroid use.  She admits that she was given steroids 5 days prior to being admitted to the hospital.  She is taking the metformin. She feels better.  Denies headache, dizziness, visual changes, shortness of breath, dyspnea on exertion, chest pain, nausea, vomiting or any edema.  She feels a mild chest pain with deep breathing exercises. She is eating well and drinking water.    Observations/Objective: Speaking in full sentences with a shortness of breath  Assessment and Plan:  Primary Diagnosis & Pertinent Problem List: The primary encounter diagnosis was Diabetes mellitus type 2, uncontrolled, with complications (HCC). A diagnosis of Acute respiratory disease due to COVID-19 virus was also pertinent to this visit.  Visit Diagnosis: 1. Diabetes mellitus type 2, uncontrolled, with complications (HCC)   2. Acute respiratory disease due to COVID-19 virus    Follow Up Instructions:  Plan of Care  Pharmacotherapy: Medications ordered:  Meds ordered this encounter  Medications  . metFORMIN (GLUCOPHAGE) 500 MG tablet    Sig: Take 1 tablet (500 mg total) by mouth 2 (two) times daily with a meal.    Dispense:  60 tablet    Refill:  2   Order Specific Question:   Supervising Provider    Answer:   Quentin Angst [[id number]]   Medications administered during this visit: Malaiyah Achorn had no medications administered during this visit.   Provider-requested follow-up: Return in about 2 months (around 03/14/2020).  Future Appointments  Date Time Provider Department Center  03/16/2020  1:40 PM Barbette Merino, NP [facility] None     Patient instructions provided during this appointment: Patient Instructions  Diabetes Mellitus and Exercise Exercising regularly is important for your overall health, especially when you have diabetes (diabetes mellitus). Exercising is not only about losing weight. It has many other health benefits, such as increasing muscle strength and bone density and reducing body fat and stress. This leads to improved fitness, flexibility, and endurance, all of which result in better overall health. Exercise has additional benefits for people with diabetes, including:  Reducing appetite.  Helping to lower and control blood glucose.  Lowering blood pressure.  Helping to control amounts of fatty substances (lipids) in the blood, such as cholesterol and triglycerides.  Helping the body to respond better to insulin (improving insulin sensitivity).  Reducing how much insulin the body needs.  Decreasing the risk for heart disease by: ? Lowering cholesterol and triglyceride levels. ? Increasing the levels of good cholesterol. ? Lowering blood glucose levels. What is my activity plan? Your health care provider or certified diabetes educator can help you make a plan for the type and frequency of exercise (activity plan) that works for you. Make sure that you:  Do at least 150 minutes of  moderate-intensity or vigorous-intensity exercise each week. This could be brisk walking, biking, or water aerobics. ? Do stretching and strength exercises, such as yoga or weightlifting, at least 2 times a week. ? Spread out  your activity over at least 3 days of the week.  Get some form of physical activity every day. ? Do not go more than 2 days in a row without some kind of physical activity. ? Avoid being inactive for more than 30 minutes at a time. Take frequent breaks to walk or stretch.  Choose a type of exercise or activity that you enjoy, and set realistic goals.  Start slowly, and gradually increase the intensity of your exercise over time. What do I need to know about managing my diabetes?   Check your blood glucose before and after exercising. ? If your blood glucose is 240 mg/dL (34.1 mmol/L) or higher before you exercise, check your urine for ketones. If you have ketones in your urine, do not exercise until your blood glucose returns to normal. ? If your blood glucose is 100 mg/dL (5.6 mmol/L) or lower, eat a snack containing 15-20 grams of carbohydrate. Check your blood glucose 15 minutes after the snack to make sure that your level is above 100 mg/dL (5.6 mmol/L) before you start your exercise.  Know the symptoms of low blood glucose (hypoglycemia) and how to treat it. Your risk for hypoglycemia increases during and after exercise. Common symptoms of hypoglycemia can include: ? Hunger. ? Anxiety. ? Sweating and feeling clammy. ? Confusion. ? Dizziness or feeling light-headed. ? Increased heart rate or palpitations. ? Blurry vision. ? Tingling or numbness around the mouth, lips, or tongue. ? Tremors or shakes. ? Irritability.  Keep a rapid-acting carbohydrate snack available before, during, and after exercise to help prevent or treat hypoglycemia.  Avoid injecting insulin into areas of the body that are going to be exercised. For example, avoid injecting insulin into: ? The arms, when playing tennis. ? The legs, when jogging.  Keep records of your exercise habits. Doing this can help you and your health care provider adjust your diabetes management plan as needed. Write down: ? Food that  you eat before and after you exercise. ? Blood glucose levels before and after you exercise. ? The type and amount of exercise you have done. ? When your insulin is expected to peak, if you use insulin. Avoid exercising at times when your insulin is peaking.  When you start a new exercise or activity, work with your health care provider to make sure the activity is safe for you, and to adjust your insulin, medicines, or food intake as needed.  Drink plenty of water while you exercise to prevent dehydration or heat stroke. Drink enough fluid to keep your urine clear or pale yellow. Summary  Exercising regularly is important for your overall health, especially when you have diabetes (diabetes mellitus).  Exercising has many health benefits, such as increasing muscle strength and bone density and reducing body fat and stress.  Your health care provider or certified diabetes educator can help you make a plan for the type and frequency of exercise (activity plan) that works for you.  When you start a new exercise or activity, work with your health care provider to make sure the activity is safe for you, and to adjust your insulin, medicines, or food intake as needed. This information is not intended to replace advice given to you by your health care provider. Make sure you discuss any  questions you have with your health care provider. Document Revised: 06/01/2017 Document Reviewed: 04/18/2016 Elsevier Patient Education  [place].  Anlisis de hemoglobina A1c Hemoglobin A1c Test Por qu me debo realizar esta prueba? Es posible que se le realice el anlisis de hemoglobina A1c (anlisis de HbA1c) para:  Chief of Staff su riesgo de desarrollar diabetes (diabetes mellitus).  Diagnosticar la diabetes.  Controlar Immunologist (glucosa) a Scientist, research (medical) que tienen diabetes y ayudarlas a tomar decisiones respecto de su tratamiento. Este anlisis puede hacerse junto con otros  anlisis de glucemia, como los anlisis de glucemia en ayunas y Pharmacist, hospital a la glucosa oral. Sander Nephew se analiza? La hemoglobina es un tipo de protena de la sangre que transporta el oxgeno. La glucosa se adhiere a la hemoglobina para formar la hemoglobina glicosilada. Este anlisis controla la cantidad de hemoglobina glicosilada en sangre, que es un buen indicador de la cantidad promedio de glucosa en sangre en los ltimos 2 a 3 meses. Qu tipo de [place] se toma?  Para este anlisis, se extrae Truddie Coco de [place]. Por lo general, para extraerla, se introduce una aguja en un vaso sanguneo. Informe al mdico acerca de lo siguiente:  Todos los [employer], incluidos vitaminas, hierbas, gotas oftlmicas, cremas y medicamentos de venta libre.  Cualquier enfermedad de la sangre que tenga.  Cirugas a las que se someti.  Cualquier enfermedad que tenga.  Si est embarazada o podra estarlo. Cmo se informan los resultados? Los [employer] se informan como un porcentaje que indica qu cantidad de hemoglobina tiene glucosa adherida a ella (est glicosilada). El mdico comparar sus resultados con los rangos normales que se establecieron luego de Field seismologist esta prueba a un grupo grande de personas (rangos de referencia). Los rangos de referencia pueden variar entre distintos laboratorios y hospitales. Los rangos de referencia habituales para esta prueba son los siguientes:  Adultos o nios sin diabetes: del 4% al 5,6%.  Adultos o nios con diabetes y un buen control de la glucemia: menos del 7%. [place] significan los resultados? Si usted tiene diabetes:  Un resultado de menos del 7 % se considera normal, lo que significa que su glucemia est bien controlada.  Un resultado de ms del 7 % significa que su glucemia no est bien controlada y que se debe ajustar su plan de tratamiento. Si usted no tiene diabetes:  Un resultado dentro del valor de referencia se considera normal, lo que  significa que no tiene riesgo de desarrollar diabetes.  Un resultado entre 5,7 % y 6,4 % significa que tiene un riesgo alto de desarrollar diabetes y que puede tener prediabetes. La prediabetes es una afeccin que se caracteriza por un nivel de glucemia que es ms alto de lo que debera ser, pero no es lo suficientemente alto como para que se diagnostique diabetes. El hecho de ser prediabtico lo pone en riesgo de desarrollar diabetes tipo 2 (diabetes mellitus tipo 2). Es posible que se le realicen ms anlisis, incluso que se repita el anlisis de HbA1c.  Los resultados del 6,5 % o ms altos en dos anlisis de HbA1c separados significan que usted tiene diabetes. Pueden hacerle ms anlisis para confirmar el diagnstico. Los valores de HbA1c anormalmente bajos pueden ser provocados por:  Media planner.  Prdida de sangre grave.  Recibir sangre donada (transfusiones).  Valores bajos en el recuento de glbulos rojos (anemia).  Insuficiencia renal a largo plazo.  Algunas formas inusuales (variantes) de la hemoglobina. Hable con el mdico  sobre lo que [employer]. Preguntas para hacerle al mdico Consulte a su mdico o pregunte en el departamento donde se realiza la prueba acerca de lo siguiente:  Cundo estarn disponibles mis resultados?  Cmo obtendr mis resultados?  Cules son mis opciones de tratamiento?  Qu otras pruebas necesito?  Cules son los prximos pasos que debo seguir? Resumen  El anlisis de hemoglobina A1c (anlisis de HbA1c) puede hacerse para evaluar su riesgo de desarrollar diabetes, para diagnosticar la diabetes y para Systems developer control a largo plazo del Psychologist, counselling sangre (glucosa) en personas que tienen diabetes y ayudarlas a tomar decisiones respecto al TEFL teacher.  La hemoglobina es un tipo de protena de la sangre que transporta el oxgeno. La glucosa se adhiere a la hemoglobina para formar la hemoglobina glicosilada. Este anlisis  controla la cantidad de hemoglobina glicosilada en sangre, que es un buen indicador de la cantidad promedio de glucosa en sangre en los ltimos 2 a 3 meses.  Hable con el mdico sobre lo que [employer]. Esta informacin no tiene Theme park manager el consejo del mdico. Asegrese de hacerle al mdico cualquier pregunta que tenga. Document Revised: 09/05/2017 Document Reviewed: 09/05/2017 Elsevier Patient Education  2020 [employer].    I discussed the assessment and treatment plan with the patient. The patient was provided an opportunity to ask questions and all were answered. The patient agreed with the plan and demonstrated an understanding of the instructions.   The patient was advised to call back or seek an in-person evaluation if the symptoms worsen or if the condition fails to improve as anticipated.  I provided 17 minutes of non-face-to-face time during this encounter.   Barbette Merino, NP

## 2020-01-15 NOTE — Patient Instructions (Signed)
Diabetes Mellitus and Exercise Exercising regularly is important for your overall health, especially when you have diabetes (diabetes mellitus). Exercising is not only about losing weight. It has many other health benefits, such as increasing muscle strength and bone density and reducing body fat and stress. This leads to improved fitness, flexibility, and endurance, all of which result in better overall health. Exercise has additional benefits for people with diabetes, including:  Reducing appetite.  Helping to lower and control blood glucose.  Lowering blood pressure.  Helping to control amounts of fatty substances (lipids) in the blood, such as cholesterol and triglycerides.  Helping the body to respond better to insulin (improving insulin sensitivity).  Reducing how much insulin the body needs.  Decreasing the risk for heart disease by: ? Lowering cholesterol and triglyceride levels. ? Increasing the levels of good cholesterol. ? Lowering blood glucose levels. What is my activity plan? Your health care provider or certified diabetes educator can help you make a plan for the type and frequency of exercise (activity plan) that works for you. Make sure that you:  Do at least 150 minutes of moderate-intensity or vigorous-intensity exercise each week. This could be brisk walking, biking, or water aerobics. ? Do stretching and strength exercises, such as yoga or weightlifting, at least 2 times a week. ? Spread out your activity over at least 3 days of the week.  Get some form of physical activity every day. ? Do not go more than 2 days in a row without some kind of physical activity. ? Avoid being inactive for more than 30 minutes at a time. Take frequent breaks to walk or stretch.  Choose a type of exercise or activity that you enjoy, and set realistic goals.  Start slowly, and gradually increase the intensity of your exercise over time. What do I need to know about managing my  diabetes?   Check your blood glucose before and after exercising. ? If your blood glucose is 240 mg/dL (13.3 mmol/L) or higher before you exercise, check your urine for ketones. If you have ketones in your urine, do not exercise until your blood glucose returns to normal. ? If your blood glucose is 100 mg/dL (5.6 mmol/L) or lower, eat a snack containing 15-20 grams of carbohydrate. Check your blood glucose 15 minutes after the snack to make sure that your level is above 100 mg/dL (5.6 mmol/L) before you start your exercise.  Know the symptoms of low blood glucose (hypoglycemia) and how to treat it. Your risk for hypoglycemia increases during and after exercise. Common symptoms of hypoglycemia can include: ? Hunger. ? Anxiety. ? Sweating and feeling clammy. ? Confusion. ? Dizziness or feeling light-headed. ? Increased heart rate or palpitations. ? Blurry vision. ? Tingling or numbness around the mouth, lips, or tongue. ? Tremors or shakes. ? Irritability.  Keep a rapid-acting carbohydrate snack available before, during, and after exercise to help prevent or treat hypoglycemia.  Avoid injecting insulin into areas of the body that are going to be exercised. For example, avoid injecting insulin into: ? The arms, when playing tennis. ? The legs, when jogging.  Keep records of your exercise habits. Doing this can help you and your health care provider adjust your diabetes management plan as needed. Write down: ? Food that you eat before and after you exercise. ? Blood glucose levels before and after you exercise. ? The type and amount of exercise you have done. ? When your insulin is expected to peak, if you use   insulin. Avoid exercising at times when your insulin is peaking.  When you start a new exercise or activity, work with your health care provider to make sure the activity is safe for you, and to adjust your insulin, medicines, or food intake as needed.  Drink plenty of water while  you exercise to prevent dehydration or heat stroke. Drink enough fluid to keep your urine clear or pale yellow. Summary  Exercising regularly is important for your overall health, especially when you have diabetes (diabetes mellitus).  Exercising has many health benefits, such as increasing muscle strength and bone density and reducing body fat and stress.  Your health care provider or certified diabetes educator can help you make a plan for the type and frequency of exercise (activity plan) that works for you.  When you start a new exercise or activity, work with your health care provider to make sure the activity is safe for you, and to adjust your insulin, medicines, or food intake as needed. This information is not intended to replace advice given to you by your health care provider. Make sure you discuss any questions you have with your health care provider. Document Revised: 06/01/2017 Document Reviewed: 04/18/2016 Elsevier Patient Education  2020 Elsevier Inc.  Anlisis de hemoglobina A1c Hemoglobin A1c Test Por qu me debo realizar esta prueba? Es posible que se le realice el anlisis de hemoglobina A1c (anlisis de HbA1c) para:  Development worker, community su riesgo de desarrollar diabetes (diabetes mellitus).  Diagnosticar la diabetes.  Controlar Engineer, agricultural (glucosa) a Nurse, mental health que tienen diabetes y ayudarlas a tomar decisiones respecto de su tratamiento. Este anlisis puede hacerse junto con otros anlisis de glucemia, como los anlisis de glucemia en ayunas y Astronomer a la glucosa oral. Ladell Heads se analiza? La hemoglobina es un tipo de protena de la sangre que transporta el oxgeno. La glucosa se adhiere a la hemoglobina para formar la hemoglobina glicosilada. Este anlisis controla la cantidad de hemoglobina glicosilada en sangre, que es un buen indicador de la cantidad promedio de glucosa en sangre en los ltimos 2 a 3 meses. Qu tipo de [place] se toma?  Para este  anlisis, se extrae Lauris Poag de [place]. Por lo general, para extraerla, se introduce una aguja en un vaso sanguneo. Informe al mdico acerca de lo siguiente:  Todos los [employer], incluidos vitaminas, hierbas, gotas oftlmicas, cremas y [address] de [address].  Cualquier enfermedad de la sangre que tenga.  Cirugas a las que se someti.  Cualquier enfermedad que tenga.  Si est embarazada o podra estarlo. Cmo se informan los resultados? Los [employer] se informan como un porcentaje que indica qu cantidad de hemoglobina tiene glucosa adherida a ella (est glicosilada). El mdico comparar sus resultados con los rangos normales que se establecieron luego de Radio producer esta prueba a un grupo grande de personas (rangos de referencia). Los rangos de referencia pueden variar entre distintos laboratorios y hospitales. Los rangos de referencia habituales para esta prueba son los siguientes:  Adultos o nios sin diabetes: del 4% al 5,6%.  Adultos o nios con diabetes y un buen control de la glucemia: menos del 7%. Qu significan los resultados? Si usted tiene diabetes:  Un resultado de menos del 7 % se considera normal, lo que significa que su glucemia est bien controlada.  Un resultado de ms del 7 % significa que su glucemia no est bien controlada y que se debe ajustar su plan de tratamiento. Si usted no tiene  diabetes:  Un resultado dentro del valor de referencia se considera normal, lo que significa que no tiene riesgo de desarrollar diabetes.  Un resultado entre 5,7 % y 6,4 % significa que tiene un riesgo alto de desarrollar diabetes y que puede tener prediabetes. La prediabetes es una afeccin que se caracteriza por un nivel de glucemia que es ms alto de lo que debera ser, pero no es lo suficientemente alto como para que se diagnostique diabetes. El hecho de ser prediabtico lo pone en riesgo de desarrollar diabetes tipo 2 (diabetes mellitus tipo 2). Es posible  que se le realicen ms anlisis, incluso que se repita el anlisis de HbA1c.  Los resultados del 6,5 % o ms altos en dos anlisis de HbA1c separados significan que usted tiene diabetes. Pueden hacerle ms anlisis para confirmar el diagnstico. Los valores de HbA1c anormalmente bajos pueden ser provocados por:  Media planner.  Prdida de sangre grave.  Recibir sangre donada (transfusiones).  Valores bajos en el recuento de glbulos rojos (anemia).  Insuficiencia renal a largo plazo.  Algunas formas inusuales (variantes) de la hemoglobina. Hable con el mdico sobre lo que [employer]. Preguntas para hacerle al mdico Consulte a su mdico o pregunte en el departamento donde se realiza la prueba acerca de lo siguiente:  Cundo estarn disponibles mis resultados?  Cmo obtendr mis resultados?  Cules son mis opciones de tratamiento?  Qu otras pruebas necesito?  Cules son los prximos pasos que debo seguir? Resumen  El anlisis de hemoglobina A1c (anlisis de HbA1c) puede hacerse para evaluar su riesgo de desarrollar diabetes, para diagnosticar la diabetes y para Investment banker, operational control a largo plazo del Designer, television/film set sangre (glucosa) en personas que tienen diabetes y ayudarlas a tomar decisiones respecto al Clinical research associate.  La hemoglobina es un tipo de protena de la sangre que transporta el oxgeno. La glucosa se adhiere a la hemoglobina para formar la hemoglobina glicosilada. Este anlisis controla la cantidad de hemoglobina glicosilada en sangre, que es un buen indicador de la cantidad promedio de glucosa en sangre en los ltimos 2 a 3 meses.  Hable con el mdico sobre lo que [employer]. Esta informacin no tiene Marine scientist el consejo del mdico. Asegrese de hacerle al mdico cualquier pregunta que tenga. Document Revised: 09/05/2017 Document Reviewed: 09/05/2017 Elsevier Patient Education  [place].

## 2020-02-04 ENCOUNTER — Other Ambulatory Visit: Payer: Self-pay

## 2020-03-16 ENCOUNTER — Other Ambulatory Visit: Payer: Self-pay

## 2020-03-16 ENCOUNTER — Ambulatory Visit (INDEPENDENT_AMBULATORY_CARE_PROVIDER_SITE_OTHER): Payer: Self-pay | Admitting: Nurse Practitioner

## 2020-03-16 ENCOUNTER — Encounter: Payer: Self-pay | Admitting: Nurse Practitioner

## 2020-03-16 VITALS — BP 122/66 | HR 68 | Ht 61.0 in | Wt 147.0 lb

## 2020-03-16 DIAGNOSIS — E782 Mixed hyperlipidemia: Secondary | ICD-10-CM

## 2020-03-16 DIAGNOSIS — IMO0002 Reserved for concepts with insufficient information to code with codable children: Secondary | ICD-10-CM

## 2020-03-16 DIAGNOSIS — Z0000 Encounter for general adult medical examination without abnormal findings: Secondary | ICD-10-CM

## 2020-03-16 DIAGNOSIS — E118 Type 2 diabetes mellitus with unspecified complications: Secondary | ICD-10-CM

## 2020-03-16 DIAGNOSIS — E1165 Type 2 diabetes mellitus with hyperglycemia: Secondary | ICD-10-CM

## 2020-03-16 LAB — POCT URINALYSIS DIPSTICK
Bilirubin, UA: NEGATIVE
Blood, UA: NEGATIVE
Glucose, UA: NEGATIVE
Ketones, UA: NEGATIVE
Leukocytes, UA: NEGATIVE
Nitrite, UA: NEGATIVE
Protein, UA: NEGATIVE
Spec Grav, UA: 1.02 (ref 1.010–1.025)
Urobilinogen, UA: 0.2 E.U./dL
pH, UA: 5.5 (ref 5.0–8.0)

## 2020-03-16 LAB — POCT GLUCOSE (DEVICE FOR HOME USE)
Glucose Fasting, POC: 137 mg/dL — AB (ref 70–99)
POC Glucose: 137 mg/dl — AB (ref 70–99)

## 2020-03-16 LAB — POCT GLYCOSYLATED HEMOGLOBIN (HGB A1C)
HbA1c POC (<> result, manual entry): 6.5 % (ref 4.0–5.6)
HbA1c, POC (controlled diabetic range): 6.5 % (ref 0.0–7.0)
HbA1c, POC (prediabetic range): 6.5 % — AB (ref 5.7–6.4)
Hemoglobin A1C: 6.5 % — AB (ref 4.0–5.6)

## 2020-03-16 NOTE — Patient Instructions (Signed)

## 2020-03-16 NOTE — Progress Notes (Signed)
Established Patient Office Visit  Subjective:  Patient ID: Carrie Black, female    DOB: 01-Nov-1956  Age: 64 y.o. MRN: [id number]  CC:  Chief Complaint  Patient presents with  . Follow-up    2 month follow up , having headaches, having shortness of beathness, feels like her heart is racing     HPI Carrie Black presents for follow up.  She  has a past medical history of Endometriosis.   Headache Patient presents for evaluation of headache. Symptoms began about 3 months ago. Generally, the headaches last about 10 minutes and occur continuously. The headaches are usually worse with activity. The headaches are usually throbbing and pulsating and are located in top/center.  The patient rates her most severe headaches a 3 on a scale from 1 to 10. Recently, the headaches have been stable. Work attendance or other daily activities are affected by the headaches. Precipitating factors include: bending over or chaning postion . The headaches are usually not preceded by an aura. Associated neurologic symptoms: decreased physical activity, dizziness and numbness of extremities (hands). The patient denies loss of balance, muscle weakness, speech difficulties and vision problems. Home treatment has included nothing with little improvement. Other history includes: symptoms are post COV-19. She has sensitivity to light and sound. She has had some memory loss. She feels like it is short term. This has improved some however there are something that she can not d. She admits that she could not retain more. She is unable to drive. She is not able to remember. Family history includes no known family members with significant headaches. She lies on her stomach and rest this improves her headache. She admits that she was cranky and feeling panic in the beginning however this has improved. She has family support.  Diabetes Mellitus Patient presents for follow up of diabetes. Current symptoms include: none. Symptoms have  stabilized. Patient denies foot ulcerations, hyperglycemia, hypoglycemia , increased appetite, polydipsia, polyuria, visual disturbances and weight loss. Evaluation to date has included: fasting blood sugar, fasting lipid panel, hemoglobin A1C and microalbuminuria.  Home sugars: BGs consistently in an acceptable range. Current treatment: Continued metformin which has been effective. She feels like the metformin is too much. She would like to continue CBG 119 and 122  Past Medical History:  Diagnosis Date  . Endometriosis     Past Surgical History:  Procedure Laterality Date  . ABDOMINAL HYSTERECTOMY      Family History  Problem Relation Age of Onset  . Diabetes Other     Social History   Socioeconomic History  . Marital status: Married    Spouse name: Not on file  . Number of children: Not on file  . Years of education: Not on file  . Highest education level: Not on file  Occupational History  . Not on file  Tobacco Use  . Smoking status: Never Smoker  . Smokeless tobacco: Never Used  Substance and Sexual Activity  . Alcohol use: Never  . Drug use: Never  . Sexual activity: Not on file  Other Topics Concern  . Not on file  Social History Narrative  . Not on file   Social Determinants of Health   Financial Resource Strain:   . Difficulty of Paying Living Expenses:   Food Insecurity:   . Worried About Charity fundraiser in the Last Year:   . Arboriculturist in the Last Year:   Transportation Needs:   . Film/video editor (Medical):   Marland Kitchen  Lack of Transportation (Non-Medical):   Physical Activity:   . Days of Exercise per Week:   . Minutes of Exercise per Session:   Stress:   . Feeling of Stress :   Social Connections:   . Frequency of Communication with Friends and Family:   . Frequency of Social Gatherings with Friends and Family:   . Attends Religious Services:   . Active Member of Clubs or Organizations:   . Attends Banker Meetings:   Marland Kitchen  Marital Status:   Intimate Partner Violence:   . Fear of Current or Ex-Partner:   . Emotionally Abused:   Marland Kitchen Physically Abused:   . Sexually Abused:     Outpatient Medications Prior to Visit  Medication Sig Dispense Refill  . ibuprofen (ADVIL) 200 MG tablet Take 400-800 mg by mouth every 6 (six) hours as needed for fever or headache (pain).    . metFORMIN (GLUCOPHAGE) 500 MG tablet Take 1 tablet (500 mg total) by mouth 2 (two) times daily with a meal. 60 tablet 2   No facility-administered medications prior to visit.    Allergies  Allergen Reactions  . Hydrocodone Itching  . Hydrocodone-Acetaminophen Itching  . Morphine Itching    ROS Review of Systems    Objective:    Physical Exam  Constitutional: She is oriented to person, place, and time. She appears well-developed.  HENT:  Head: Normocephalic.  Cardiovascular: Normal rate, regular rhythm, normal heart sounds and intact distal pulses.  Pulmonary/Chest: Effort normal and breath sounds normal.  Abdominal: Soft. Bowel sounds are normal.  Musculoskeletal:        General: Normal range of motion.     Cervical back: Normal range of motion and neck supple.  Neurological: She is alert and oriented to person, place, and time.  Skin: Skin is warm and dry.  Psychiatric: She has a normal mood and affect. Her behavior is normal. Judgment and thought content normal.    BP 122/66 (BP Location: Left Arm, Patient Position: Sitting)   Pulse 68   Ht 5\' 1"  (1.549 m)   Wt 147 lb (66.7 kg)   SpO2 100%   BMI 27.78 kg/m  Wt Readings from Last 3 Encounters:  03/16/20 147 lb (66.7 kg)  12/14/19 140 lb (63.5 kg)     Health Maintenance Due  Topic Date Due  . PNEUMOCOCCAL POLYSACCHARIDE VACCINE AGE 75-64 HIGH RISK  Never done  . FOOT EXAM  Never done  . OPHTHALMOLOGY EXAM  Never done  . URINE MICROALBUMIN  Never done  . COVID-19 Vaccine (1) Never done  . TETANUS/TDAP  Never done  . PAP SMEAR-Modifier  Never done  . MAMMOGRAM   Never done  . COLONOSCOPY  Never done    There are no preventive care reminders to display for this patient.  No results found for: TSH Lab Results  Component Value Date   WBC 9.8 12/26/2019   HGB 12.3 12/26/2019   HCT 35.7 (L) 12/26/2019   MCV 81.9 12/26/2019   PLT 346 12/26/2019   Lab Results  Component Value Date   NA 142 03/16/2020   K 4.5 03/16/2020   CO2 24 12/26/2019   GLUCOSE 81 03/16/2020   BUN 13 03/16/2020   CREATININE 0.50 (L) 03/16/2020   BILITOT <0.2 03/16/2020   ALKPHOS 113 03/16/2020   AST 20 03/16/2020   ALT 25 12/26/2019   PROT 6.8 03/16/2020   ALBUMIN 4.4 03/16/2020   CALCIUM 9.5 03/16/2020   ANIONGAP 9 12/26/2019  Lab Results  Component Value Date   CHOL 233 (H) 12/17/2019   Lab Results  Component Value Date   HDL 37 (L) 12/17/2019   Lab Results  Component Value Date   LDLCALC 168 (H) 12/17/2019   Lab Results  Component Value Date   TRIG 140 12/17/2019   Lab Results  Component Value Date   CHOLHDL 6.3 12/17/2019   Lab Results  Component Value Date   HGBA1C 6.5 (A) 03/16/2020   HGBA1C 6.5 03/16/2020   HGBA1C 6.5 (A) 03/16/2020   HGBA1C 6.5 03/16/2020      Assessment & Plan:   Problem List Items Addressed This Visit    None    Visit Diagnoses    Diabetes mellitus type 2, uncontrolled, with complications (HCC)    -  Primary   Relevant Orders   POCT Urinalysis Dipstick (Completed)   POCT Glucose (Device for Home Use) (Completed)   Microalbumin/Creatinine Ratio, Urine   Comp. Metabolic Panel (12) (Completed)   POCT glycosylated hemoglobin (Hb A1C) (Completed)   Healthcare maintenance       Relevant Orders   TSH      No orders of the defined types were placed in this encounter.   Follow-up: Return in about 3 months (around 06/15/2020).    Barbette Merino, NP

## 2020-03-17 LAB — COMP. METABOLIC PANEL (12)
AST: 20 IU/L (ref 0–40)
Albumin/Globulin Ratio: 1.8 (ref 1.2–2.2)
Albumin: 4.4 g/dL (ref 3.8–4.8)
Alkaline Phosphatase: 113 IU/L (ref 39–117)
BUN/Creatinine Ratio: 26 (ref 12–28)
BUN: 13 mg/dL (ref 8–27)
Bilirubin Total: <0.2 mg/dL (ref 0.0–1.2)
Calcium: 9.5 mg/dL (ref 8.7–10.3)
Chloride: 106 mmol/L (ref 96–106)
Creatinine, Ser: 0.5 mg/dL — ABNORMAL LOW (ref 0.57–1.00)
GFR calc Af Amer: 119 mL/min/{1.73_m2} (ref >59)
GFR calc non Af Amer: 103 mL/min/{1.73_m2} (ref >59)
Globulin, Total: 2.4 g/dL (ref 1.5–4.5)
Glucose: 81 mg/dL (ref 65–99)
Potassium: 4.5 mmol/L (ref 3.5–5.2)
Sodium: 142 mmol/L (ref 134–144)
Total Protein: 6.8 g/dL (ref 6.0–8.5)

## 2020-03-17 LAB — MICROALBUMIN / CREATININE URINE RATIO
Creatinine, Urine: 32.5 mg/dL
Microalb/Creat Ratio: 34 mg/g creat — ABNORMAL HIGH (ref 0–29)
Microalbumin, Urine: 11.2 ug/mL

## 2020-03-18 ENCOUNTER — Encounter: Payer: Self-pay | Admitting: Nurse Practitioner

## 2020-03-18 DIAGNOSIS — R809 Proteinuria, unspecified: Secondary | ICD-10-CM | POA: Insufficient documentation

## 2020-03-18 LAB — TSH: TSH: 2.73 u[IU]/mL (ref 0.450–4.500)

## 2020-03-18 LAB — SPECIMEN STATUS REPORT

## 2020-06-05 ENCOUNTER — Other Ambulatory Visit: Payer: Self-pay | Admitting: Nurse Practitioner

## 2020-06-05 ENCOUNTER — Telehealth (HOSPITAL_COMMUNITY): Payer: Self-pay

## 2020-06-05 MED ORDER — PNEUMOVAX 23 25 MCG/0.5ML IJ INJ: 0.5000 mL | INJECTION | INTRAMUSCULAR | 0 refills | Status: AC

## 2020-06-05 MED ORDER — PNEUMOCOCCAL 13-VAL CONJ VACC IM SUSP: 0.5000 mL | INTRAMUSCULAR | 0 refills | Status: AC

## 2020-06-05 NOTE — Telephone Encounter (Signed)
Please send pers for Pneumovax 23 & confirm w/ pharm that she is able to recieve vaxx. Daughter would also like a call back at your earliest convenience

## 2020-06-05 NOTE — Telephone Encounter (Signed)
Can you please see what pharmacy is needing  Thanks

## 2020-06-05 NOTE — Telephone Encounter (Signed)
Please send rx for peumovax 1 and 2 to [facility] in [place] which is on file. Thank you.

## 2020-06-05 NOTE — Telephone Encounter (Signed)
sent 

## 2020-06-05 NOTE — Progress Notes (Unsigned)
   [facility] [address] [place], [place]  [address] Phone:  [phone]   Fax:  [phone] 

## 2020-06-17 ENCOUNTER — Ambulatory Visit (INDEPENDENT_AMBULATORY_CARE_PROVIDER_SITE_OTHER): Payer: Self-pay | Admitting: Nurse Practitioner

## 2020-06-17 ENCOUNTER — Other Ambulatory Visit: Payer: Self-pay

## 2020-06-17 ENCOUNTER — Encounter: Payer: Self-pay | Admitting: Nurse Practitioner

## 2020-06-17 VITALS — BP 123/78 | HR 60 | Temp 98.0°F | Resp 16 | Ht 61.0 in | Wt 146.0 lb

## 2020-06-17 DIAGNOSIS — R519 Headache, unspecified: Secondary | ICD-10-CM

## 2020-06-17 DIAGNOSIS — E118 Type 2 diabetes mellitus with unspecified complications: Secondary | ICD-10-CM

## 2020-06-17 DIAGNOSIS — IMO0002 Reserved for concepts with insufficient information to code with codable children: Secondary | ICD-10-CM

## 2020-06-17 DIAGNOSIS — E1165 Type 2 diabetes mellitus with hyperglycemia: Secondary | ICD-10-CM

## 2020-06-17 LAB — POCT URINALYSIS DIPSTICK
Bilirubin, UA: NEGATIVE
Blood, UA: NEGATIVE
Glucose, UA: NEGATIVE
Ketones, UA: NEGATIVE
Leukocytes, UA: NEGATIVE
Nitrite, UA: NEGATIVE
Protein, UA: NEGATIVE
Spec Grav, UA: 1.01 (ref 1.010–1.025)
Urobilinogen, UA: 0.2 E.U./dL
pH, UA: 7 (ref 5.0–8.0)

## 2020-06-17 LAB — POCT GLYCOSYLATED HEMOGLOBIN (HGB A1C): Hemoglobin A1C: 5.9 % — AB (ref 4.0–5.6)

## 2020-06-17 NOTE — Progress Notes (Signed)
[facility] Patient [facility] [address] [place], [place]  [address] Phone:  [phone]   Fax:  [phone]    Established Patient Office Visit  Subjective:  Patient ID: Carrie Black, female    DOB: Sep 19, 1956  Age: 64 y.o. MRN: [id number]  CC:  Chief Complaint  Patient presents with  . Diabetes  . Headache    since covid     HPI Jayleana Colberg presents for follow up. She  has a past medical history of Endometriosis.    Diabetes Mellitus Patient presents for follow up of diabetes. Current symptoms include: none. Symptoms have stabilized. Patient denies foot ulcerations, hyperglycemia, hypoglycemia , increased appetite, nausea, paresthesia of the feet, polydipsia, polyuria, vomiting and weight loss. Evaluation to date has included: fasting blood sugar, fasting lipid panel and hemoglobin A1C.  Home sugars: BGs consistently in an acceptable range. Current treatment: more intensive attention to diet which has been effective.   She is concern about if she is able to get  She is having tension headaches. Home treatment has included [employer] with some improvement. Headaches are occurring intermittent. Generally, the headaches last about a few hours. Work attendance or other daily activities are not affected by the headaches. The patient denies decreased physical activity.  Past Medical History:  Diagnosis Date  . Endometriosis     Past Surgical History:  Procedure Laterality Date  . ABDOMINAL HYSTERECTOMY      Family History  Problem Relation Age of Onset  . Diabetes Other     Social History   Socioeconomic History  . Marital status: Married    Spouse name: Not on file  . Number of children: Not on file  . Years of education: Not on file  . Highest education level: Not on file  Occupational History  . Not on file  Tobacco Use  . Smoking status: Never Smoker  . Smokeless tobacco: Never Used  Vaping Use  . Vaping Use: Never used  Substance and Sexual Activity  .  Alcohol use: Never  . Drug use: Never  . Sexual activity: Not on file  Other Topics Concern  . Not on file  Social History Narrative  . Not on file   Social Determinants of Health   Financial Resource Strain:   . Difficulty of Paying Living Expenses:   Food Insecurity:   . Worried About Programme researcher, broadcasting/film/video in the Last Year:   . Barista in the Last Year:   Transportation Needs:   . Freight forwarder (Medical):   Marland Kitchen Lack of Transportation (Non-Medical):   Physical Activity:   . Days of Exercise per Week:   . Minutes of Exercise per Session:   Stress:   . Feeling of Stress :   Social Connections:   . Frequency of Communication with Friends and Family:   . Frequency of Social Gatherings with Friends and Family:   . Attends Religious Services:   . Active Member of Clubs or Organizations:   . Attends Banker Meetings:   Marland Kitchen Marital Status:   Intimate Partner Violence:   . Fear of Current or Ex-Partner:   . Emotionally Abused:   Marland Kitchen Physically Abused:   . Sexually Abused:     Outpatient Medications Prior to Visit  Medication Sig Dispense Refill  . ibuprofen (ADVIL) 200 MG tablet Take 400-800 mg by mouth every 6 (six) hours as needed for fever or headache (pain). (Patient not taking: Reported on 06/17/2020)    .  metFORMIN (GLUCOPHAGE) 500 MG tablet Take 1 tablet (500 mg total) by mouth 2 (two) times daily with a meal. (Patient not taking: Reported on 06/17/2020) 60 tablet 2   No facility-administered medications prior to visit.    Allergies  Allergen Reactions  . Hydrocodone Itching  . Hydrocodone-Acetaminophen Itching  . Morphine Itching    ROS Review of Systems  All other systems reviewed and are negative.     Objective:    Physical Exam Constitutional:      General: She is not in acute distress.    Appearance: She is not ill-appearing, toxic-appearing or diaphoretic.  HENT:     Head: Normocephalic.     Mouth/Throat:     Mouth: Mucous  membranes are moist.  Eyes:     Extraocular Movements: Extraocular movements intact.  Cardiovascular:     Rate and Rhythm: Normal rate and regular rhythm.     Heart sounds: Normal heart sounds.  Pulmonary:     Effort: Pulmonary effort is normal.     Breath sounds: Normal breath sounds.  Abdominal:     General: Bowel sounds are normal.     Palpations: Abdomen is soft.  Musculoskeletal:        General: Normal range of motion.     Cervical back: Normal range of motion.  Skin:    General: Skin is warm and dry.     Capillary Refill: Capillary refill takes less than 2 seconds.  Neurological:     Mental Status: She is alert and oriented to person, place, and time.     Cranial Nerves: No cranial nerve deficit.     Sensory: No sensory deficit.     Motor: No weakness.     Coordination: Romberg sign negative. Coordination normal.     Gait: Gait normal.  Psychiatric:        Mood and Affect: Mood normal.        Speech: Speech normal.        Behavior: Behavior normal.     BP 123/78 (BP Location: Left Arm, Patient Position: Sitting, Cuff Size: Normal)   Pulse 60   Temp 98 F (36.7 C) (Oral)   Resp 16   Ht 5\' 1"  (1.549 m)   Wt 146 lb (66.2 kg)   SpO2 100%   BMI 27.59 kg/m  Wt Readings from Last 3 Encounters:  06/17/20 146 lb (66.2 kg)  03/16/20 147 lb (66.7 kg)  12/14/19 140 lb (63.5 kg)     Health Maintenance Due  Topic Date Due  . OPHTHALMOLOGY EXAM  Never done  . PAP SMEAR-Modifier  Never done    There are no preventive care reminders to display for this patient.  Lab Results  Component Value Date   TSH 2.730 03/16/2020   Lab Results  Component Value Date   WBC 9.8 12/26/2019   HGB 12.3 12/26/2019   HCT 35.7 (L) 12/26/2019   MCV 81.9 12/26/2019   PLT 346 12/26/2019   Lab Results  Component Value Date   NA 142 03/16/2020   K 4.5 03/16/2020   CO2 24 12/26/2019   GLUCOSE 81 03/16/2020   BUN 13 03/16/2020   CREATININE 0.50 (L) 03/16/2020   BILITOT <0.2  03/16/2020   ALKPHOS 113 03/16/2020   AST 20 03/16/2020   ALT 25 12/26/2019   PROT 6.8 03/16/2020   ALBUMIN 4.4 03/16/2020   CALCIUM 9.5 03/16/2020   ANIONGAP 9 12/26/2019   Lab Results  Component Value Date   CHOL 233 (  H) 12/17/2019   Lab Results  Component Value Date   HDL 37 (L) 12/17/2019   Lab Results  Component Value Date   LDLCALC 168 (H) 12/17/2019   Lab Results  Component Value Date   TRIG 140 12/17/2019   Lab Results  Component Value Date   CHOLHDL 6.3 12/17/2019   Lab Results  Component Value Date   HGBA1C 5.9 (A) 06/17/2020      Assessment & Plan:   Problem List Items Addressed This Visit    None    Visit Diagnoses    Diabetes mellitus type 2, uncontrolled, with complications (HCC)    -  Primary   Encourage compliance with current treatment regimen  Dose adjustment no metformin at this time  Encourage regular CBG monitoring Encourage contacting office if excessive hyperglycemia and or hypoglycemia Lifestyle modification with healthy diet (fewer calories, more high fiber foods, whole grains and non-starchy vegetables, lower fat meat and fish, low-fat diary include healthy oils) regular exercise (physical activity) and weight loss Opthalmology exam discussed  Nutritional consult recommended   Relevant Orders   Urinalysis Dipstick (Completed)   HgB A1c (Completed)   Lipid panel   Intractable episodic headache, unspecified headache type   Continue current treatment if symptoms persist will consider post COVID clinic         No orders of the defined types were placed in this encounter.   Follow-up: Return in about 3 months (around 09/17/2020).    Barbette Merino, NP

## 2020-06-17 NOTE — Patient Instructions (Signed)
Hiperglucemia Hyperglycemia La hiperglucemia se produce cuando el nivel de azcar (glucosa) en la sangre es demasiado alto. Puede suceder que no cause sntomas. Si tiene sntomas, estos pueden incluir seales de advertencia, por ejemplo, las siguientes:  Estar ms sediento que lo habitual.  Hambre.  Cansancio.  Necesidad de Garment/textile technologist con mayor frecuencia que lo normal.  Visin borrosa. Si la afeccin empeora, puede presentar otros sntomas, como los siguientes:  [place].  Falta de hambre (inapetencia).  Aliento con [employer] a fruta.  Debilidad.  Aumento o prdida de peso no planificados. Probablemente pierda de peso muy rpidamente.  Hormigueo o adormecimiento en las manos o los pies.  Dolor de [place].  Cuando se pellizca la piel, esta no vuelve rpidamente a su lugar al soltarla (turgencia cutnea deficiente).  Dolor en el vientre (abdomen).  Cortes o moretones que tardan en curarse. La hiperglucemia puede presentarse tanto en personas que tienen diabetes como en las que no la tienen. Puede desarrollarse despacio o rpidamente y ser una emergencia mdica. Siga estas indicaciones en su casa: Instrucciones generales  [employer] de venta libre y los recetados solamente como se lo haya indicado el mdico.  No consuma productos que contengan nicotina o tabaco, como cigarrillos y Psychologist, sport and exercise. Si necesita ayuda para dejar de fumar, consulte al mdico.  Limite el consumo de alcohol a no ms de 1 medida por da si es mujer y no est Music therapist, y a 2 medidas si es hombre. Una medida equivale a 12oz (310ml) de cerveza, 5oz (156ml) de vino o 1oz (35ml) de bebidas alcohlicas de alta graduacin.  Controle el estrs. Si necesita ayuda para lograrlo, consulte al mdico.  Concurra a todas las visitas de control como se lo haya indicado el mdico. Esto es importante. Comida y bebida   Mantenga un peso saludable.  Haga ejercicios con regularidad como se  lo haya indicado el mdico.  Beba la cantidad suficiente de lquido, especialmente si: ? Realiza actividad fsica. ? Se enferma. ? El clima es caluroso.  Consuma alimentos saludables, por ejemplo: ? Protenas con bajo contenido de grasa [place]). ? Carbohidratos complejos, como panes integrales o arroz integral. ? Lambert Mody y verduras frescas. ? Productos lcteos descremados. ? Grasas saludables.  Beba suficiente lquido como para mantener el pis (orina) claro o de color amarillo plido. Si usted tiene diabetes:   Asegrese de Charity fundraiser de la hiperglucemia.  Siga el plan de control de la diabetes como se lo haya indicado el mdico. Haga lo siguiente: ? Aplquese la insulina y tome los medicamentos como se lo hayan indicado. ? Siga el plan de ejercicio. ? Siga el plan de comidas. Coma a horario. No se saltee comidas. ? Contrlese el nivel de azcar en la sangre con la frecuencia que le hayan indicado. Contrlese antes y despus de hacer actividad fsica. Si hace ejercicio durante ms tiempo o de [employer] de lo habitual, asegrese de Chief Technology Officer su nivel de azcar en la sangre con mayor frecuencia. ? Siga el plan para los das de enfermedad cuando no pueda comer o beber normalmente. Arme este plan de antemano con el mdico.  Comparta su plan de control de la diabetes con sus compaeros de trabajo y de la escuela, y con las otras personas que viven en su hogar.  Contrlese las cetonas en la orina cuando est enfermo y [place] se lo haya indicado el mdico.  Lleve consigo una tarjeta, o use un brazalete o una medalla que indiquen que es diabtico.  Comunquese con un mdico si:  El nivel de azcar en la sangre est por encima de 240mg /dl ( ) durante 2das seguidos.  Tiene problemas para 70,6CBJS/E de azcar en la sangre dentro del intervalo deseado.  El nivel de azcar en la sangre le aumenta a menudo. Solicite ayuda de inmediato si:  Tiene dificultad  para respirar.  Tiene cambios en la manera de sentirse, pensar o actuar (estado mental).  Tiene ganas constantes de vomitar (nuseas).  No puede dejar de vomitar. Estos sntomas pueden Futures trader. No espere hasta que los sntomas desaparezcan. Solicite atencin mdica de inmediato. Comunquese con el servicio de emergencias de su localidad (911 en los [place]). No conduzca por sus propios medios Customer service manager hospital. Resumen  La hiperglucemia se produce cuando el nivel de azcar (glucosa) en la sangre es demasiado alto.  La hiperglucemia puede presentarse tanto en personas que tienen diabetes como en las que no la tienen.  Asegrese de beber la cantidad suficiente de lquido, de comer alimentos saludables y de hacer actividad fsica con regularidad.  Comunquese con el mdico si tiene problemas para [employer] de azcar en la sangre dentro del intervalo deseado. Esta informacin no tiene Futures trader el consejo del mdico. Asegrese de hacerle al mdico cualquier pregunta que tenga. Document Revised: 07/04/2017 Document Reviewed: 07/14/2015 Elsevier Patient Education  2020 Elsevier Inc.   Informacin bsica sobre la diabetes Diabetes Basics  La diabetes (diabetes mellitus) es una enfermedad de larga duracin (crnica). Se produce cuando el cuerpo no utiliza 07/16/2015 (glucosa) que se libera de los alimentos despus de comer. La diabetes puede deberse a uno de Artist o a ambos:  El pncreas no produce suficiente cantidad de una hormona llamada insulina.  El cuerpo no reacciona de forma normal a la insulina que produce. La insulina permite que ciertos azcares (glucosa) ingresen a las clulas del cuerpo. Esto le proporciona energa. Si tiene diabetes, los azcares no pueden ingresar a las clulas. Esto produce un aumento del nivel de [employer] (hiperglucemia). Sigue estas instrucciones en tu casa: Cmo se trata la  diabetes? Es posible que tenga que administrarse insulina u otros medicamentos para la diabetes todos los [place] para mantener el nivel de KALIX en la sangre equilibrado. Adminstrese los medicamentos para la diabetes todos los [place] se lo haya indicado el mdico. Haga una lista de los medicamentos para la diabetes aqu: Medicamentos para la diabetes  Nombre del medicamento: ______________________________ ? Cantidad (dosis): ________________ [place] (a.m./p.m.): _______________ Mammie Russian: ___________________________________  Cleon Dew medicamento: ______________________________ ? Cantidad (dosis): ________________ Josephine Igo (a.m./p.m.): _______________ Mammie Russian: ___________________________________  Cleon Dew medicamento: ______________________________ ? Cantidad (dosis): ________________ Josephine Igo (a.m./p.m.): _______________ Mammie Russian: ___________________________________ Si Cleon Dew insulina, aprender cmo aplicrsela con inyecciones. Es posible que deba ajustar la cantidad en funcin de los alimentos que coma. Haga una lista de los tipos de [place] P[place] aqu: Insulina  Tipo de insulina: ______________________________ ? Cantidad (dosis): ________________ [place] (a.m./p.m.): _______________ Mammie Russian: ___________________________________  Cleon Dew: ______________________________ ? Cantidad (dosis): ________________ Levander Campion (a.m./p.m.): _______________ Mammie Russian: ___________________________________  Cleon Dew: ______________________________ ? Cantidad (dosis): ________________ Levander Campion (a.m./p.m.): _______________ Mammie Russian: ___________________________________  Cleon Dew: ______________________________ ? Cantidad (dosis): ________________ Levander Campion (a.m./p.m.): _______________ Mammie Russian: ___________________________________  Cleon Dew: ______________________________ ? Cantidad (dosis): ________________ Levander Campion (a.m./p.m.): _______________ Mammie Russian: ___________________________________ Cmo me  controlo el nivel de azcar en la sangre?  Controle sus niveles de azcar en la sangre con un medidor de glucemia segn las indicaciones del mdico.  El mdico fijar los objetivos del tratamiento para usted. Generalmente, los resultados de los niveles de azcar en la sangre deben ser los siguientes:  Antes de las comidas (preprandial): de 80 a 130mg /dl (de 4,4 a ).  Despus de las comidas (posprandial): por debajo de 180mg /dl (1,6XWRU/E).  Nivel de A1c: menos del 7%. Anote las veces que se controlar los niveles de azcar en la sangre: Controles de azcar en la sangre  Hora: _______________ : ___________________________________  [id number]: _______________ Notas: ___________________________________  Hora: _______________ Notas: ___________________________________  Hora: _______________ Notas: ___________________________________  Hora: _______________ Notas: ___________________________________  Hora: _______________ Notas: ___________________________________  Cleon Dew debo saber acerca del nivel bajo de azcar en la sangre? Un nivel bajo de azcar en la sangre se denomina hipoglucemia. Este cuadro ocurre cuando el nivel de azcar en la sangre es igual o menor que 70mg /dl (Mammie Russian). Entre los sntomas, se pueden incluir los siguientes:  Sentir: ? [place]. ? Preocupacin o nervios (ansiedad). ? Sudoracin y . ? Confusin. ? Mareos. ? Somnolencia. ? Ganas de vomitar (nuseas).  Tener: ? Latidos cardacos acelerados. ? Dolor de 1,9JYNW/G. ? Cambios en la visin. ? Hormigueo y falta de sensibilidad (entumecimiento) alrededor de la boca, los labios o la [place]. ? Movimientos espasmdicos que no puede controlar (convulsiones).  Dificultades para hacer lo siguiente: ? Moverse (coordinacin). ? Dormir. ? Desmayos. ? Molestarse con facilidad (irritabilidad). Tratamiento del nivel bajo de azcar en la sangre Para tratar un nivel bajo de azcar en la sangre,  ingiera un alimento o una bebida azucarada de inmediato. Si puede pensar con claridad y tragar de manera segura, siga la regla 15/15, que consiste en lo siguiente:  Consuma 15gramos de un hidrato de carbono de accin rpida (carbohidrato). Hable con su mdico acerca de cunto debera consumir.  Algunos hidratos de carbono de accin rpida son: ? Comprimidos de azcar (pastillas de glucosa). Consuma 3o 4pastillas de glucosa. ? De 6 a 8unidades de caramelos duros. ? De 4 a 6onzas (de 120 a [employer]) de jugo de frutas. ? De 4 a 6onzas (de 120 a [place]) de refresco comn (no diettico). ? 1 cucharada (35ml) de miel o azcar.  Contrlese el nivel de azcar en la sangre despus de ingerir el hidrato de carbono.  Si el nivel de azcar en la sangre todava es igual o menor que 70mg /dl ( ), ingiera nuevamente 15gramos de un hidrato de carbono.  Si el nivel de azcar en la sangre no supera los 70mg /dl (12m) despus de 3intentos, solicite ayuda de inmediato.  Ingiera una comida o una colacin en el transcurso de 1hora despus de que el nivel de azcar en la sangre se haya normalizado. Tratamiento del nivel muy bajo de azcar en la sangre Si el nivel de azcar en la sangre es igual o menor que 54mg /dl (1mmol/l), significa que est muy bajo (hipoglucemia grave). Esto es . No espere a ver si los sntomas desaparecen. Solicite atencin mdica de inmediato. Comunquese con el servicio de emergencias de su localidad (911 en los [place]). No conduzca por sus propios medios 9,5AOZH/Y hospital. Preguntas para hacerle al mdico  Es necesario que me rena con en el cuidado de la diabetes?  Qu equipos necesitar para cuidarme en casa?  Qu medicamentos para la diabetes necesito? Cundo debo tomarlos?  Con qu frecuencia debo controlar mi nivel de azcar en la sangre?  A qu nmero puedo llamar si tengo preguntas?  Cundo es mi  prxima cita con el mdico?  Dnde puedo encontrar un grupo de apoyo para las personas con diabetes? Dnde buscar ms informacin  American Diabetes Association (Asociacin Estadounidense de la Diabetes): www.diabetes.org  American Association of Diabetes Educators (Asociacin Estadounidense de Instructores para el Cuidado de la Diabetes): www.diabeteseducator.org/patient-resources Comunquese con un mdico si:  El nivel de azcar en la sangre es igual o mayor que 240mg /dl ( ) durante 2das seguidos.  Ha estado enfermo o ha tenido fiebre durante 2das o ms y no mejora.  Tiene alguno de estos problemas durante ms de 6horas: ? No puede comer ni beber. ? Siente malestar estomacal (nuseas). ? Vomita. ? Presenta heces lquidas (diarrea). Solicite ayuda inmediatamente si:  El nivel de azcar en la sangre est por debajo de 54mg /dl (68mmol/l).  Se siente confundida.  Tiene dificultad para hacer lo siguiente: ? Pensar con claridad. ? La respiracin. Resumen  La diabetes (diabetes mellitus) es una enfermedad de larga duracin (crnica). Se produce cuando el cuerpo no utiliza (glucosa) que se libera de los alimentos despus de la digestin.  Aplquese la insulina y tome los medicamentos para la diabetes como se lo hayan indicado.  Contrlese el nivel de azcar en la sangre todos los [place], con la frecuencia que le hayan indicado.  Concurra a todas las visitas de Artist se lo haya indicado el mdico. Esto es importante. Esta informacin no tiene KALIX el consejo del mdico. Asegrese de hacerle al mdico cualquier pregunta que tenga. Document Revised: 01/02/2019 Document Reviewed: 03/16/2018 Elsevier Patient Education  2020 03/03/2019.

## 2020-09-17 ENCOUNTER — Ambulatory Visit: Payer: Self-pay | Admitting: Nurse Practitioner

## 2021-01-26 IMAGING — DX DG CHEST 1V PORT
1 series · 1 of 1 positions shown · non-contrast
Comparison: None

CLINICAL DATA: Shortness of breath

EXAM:
PORTABLE CHEST 1 VIEW

[chest ap]
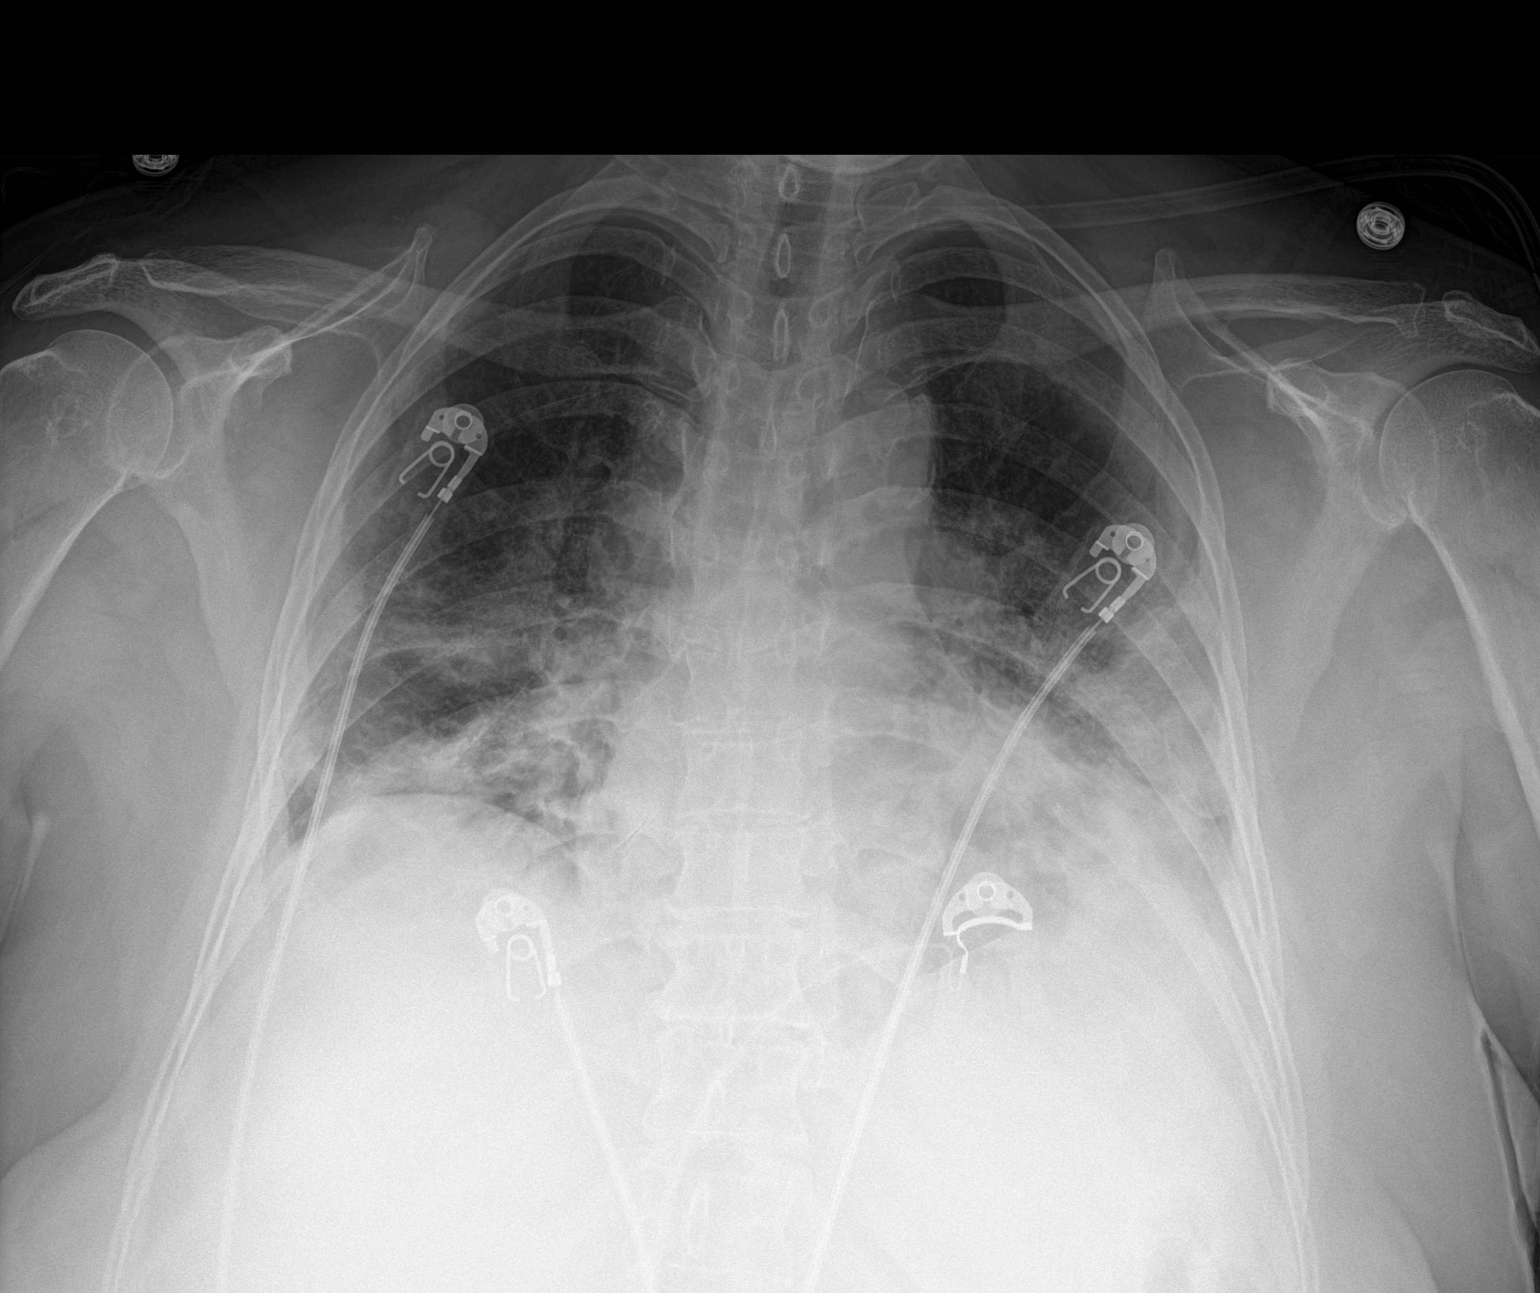

[1 of 1 positions shown; findings below may reference images not displayed]

FINDINGS: There is bilateral lower lobe and to lesser extent upper lobe
airspace disease most concerning for multilobar pneumonia including
atypical viral pneumonia. There is no pleural effusion or
pneumothorax. The heart and mediastinal contours are unremarkable.

There is no acute osseous abnormality.
IMPRESSION: Bilateral lower lobe and to lesser extent upper lobe airspace
disease most concerning for multilobar pneumonia including atypical
viral pneumonia.

## 2021-02-01 IMAGING — DX DG CHEST 1V PORT
1 series · 1 of 1 positions shown · non-contrast
Comparison: 12/14/2019.

CLINICAL DATA: COVID pneumonia.

EXAM:
PORTABLE CHEST 1 VIEW

[chest]
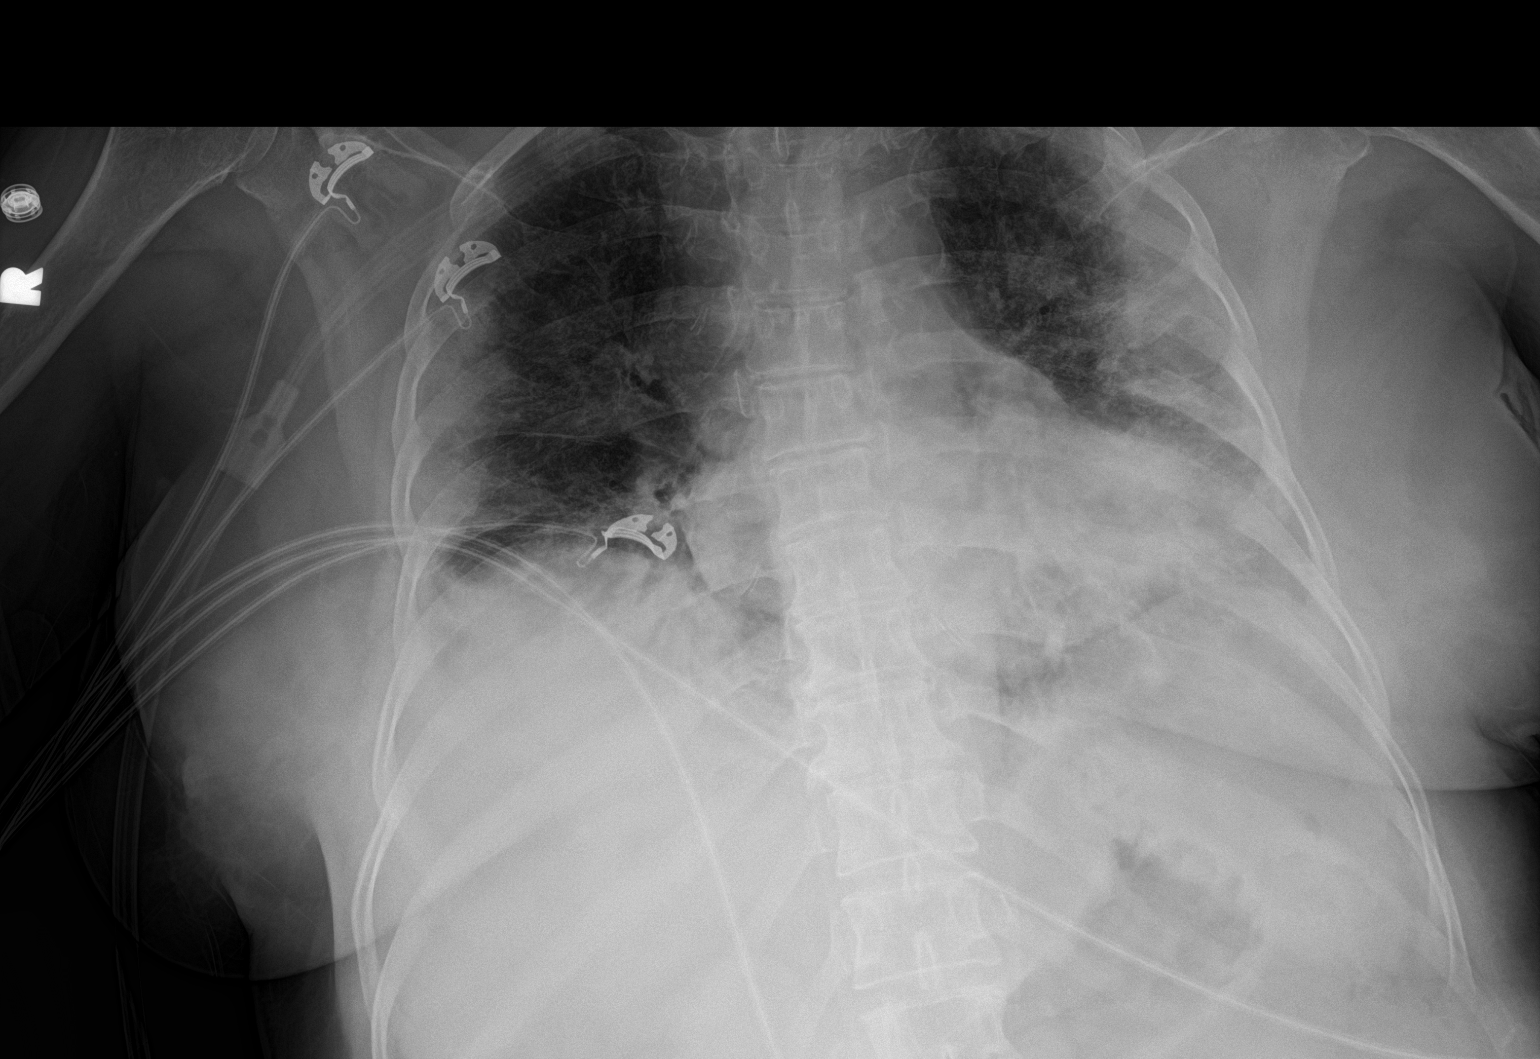

[1 of 1 positions shown; findings below may reference images not displayed]

FINDINGS: Mild upper mediastinal prominence. This may be from AP technique and
prominent great vessels. A process such as adenopathy cannot be
excluded. Follow-up PA and lateral chest x-ray suggested. Heart size
normal. Persistent prominent bilateral pulmonary infiltrates again
noted. Slight improvement in aeration in lung bases noted from prior
exam. New infiltrate noted in the left upper lung. No pleural
effusion or pneumothorax.
IMPRESSION: 1. Mild upper mediastinal prominence. This may be from AP technique
and prominent great vessels. A process such as adenopathy cannot be
excluded. Follow-up PA and lateral chest x-ray suggested.

2. Persistent prominent bilateral pulmonary infiltrates again noted.
Slight improvement in aeration of lung bases noted from prior exam.
New infiltrate in the left upper lung noted.

## 2021-04-21 DEATH — deceased
